# Patient Record
Sex: Female | Born: 1965 | Race: White | Hispanic: No | Marital: Single | Smoking: Current every day smoker
Health system: Southern US, Community
[De-identification: ages and names within clinical notes are randomized; demographics above are authoritative.]

## PROBLEM LIST (undated history)

## (undated) DIAGNOSIS — F419 Anxiety disorder, unspecified: Secondary | ICD-10-CM

## (undated) DIAGNOSIS — G4730 Sleep apnea, unspecified: Secondary | ICD-10-CM

## (undated) DIAGNOSIS — M797 Fibromyalgia: Secondary | ICD-10-CM

## (undated) DIAGNOSIS — M5030 Other cervical disc degeneration, unspecified cervical region: Secondary | ICD-10-CM

## (undated) DIAGNOSIS — F988 Other specified behavioral and emotional disorders with onset usually occurring in childhood and adolescence: Secondary | ICD-10-CM

## (undated) DIAGNOSIS — M25519 Pain in unspecified shoulder: Secondary | ICD-10-CM

## (undated) DIAGNOSIS — K219 Gastro-esophageal reflux disease without esophagitis: Secondary | ICD-10-CM

## (undated) DIAGNOSIS — F329 Major depressive disorder, single episode, unspecified: Secondary | ICD-10-CM

## (undated) DIAGNOSIS — G8929 Other chronic pain: Secondary | ICD-10-CM

## (undated) DIAGNOSIS — F17200 Nicotine dependence, unspecified, uncomplicated: Secondary | ICD-10-CM

## (undated) DIAGNOSIS — F32A Depression, unspecified: Secondary | ICD-10-CM

## (undated) HISTORY — DX: Major depressive disorder, single episode, unspecified: F32.9

## (undated) HISTORY — DX: Other chronic pain: G89.29

## (undated) HISTORY — DX: Nicotine dependence, unspecified, uncomplicated: F17.200

## (undated) HISTORY — DX: Depression, unspecified: F32.A

## (undated) HISTORY — PX: WISDOM TOOTH EXTRACTION: SHX21

## (undated) HISTORY — DX: Fibromyalgia: M79.7

## (undated) HISTORY — DX: Pain in unspecified shoulder: M25.519

## (undated) HISTORY — DX: Other specified behavioral and emotional disorders with onset usually occurring in childhood and adolescence: F98.8

## (undated) HISTORY — DX: Anxiety disorder, unspecified: F41.9

## (undated) HISTORY — DX: Gastro-esophageal reflux disease without esophagitis: K21.9

---

## 1990-09-08 HISTORY — PX: TUBAL LIGATION: SHX77

## 2001-07-27 ENCOUNTER — Other Ambulatory Visit: Admission: RE | Admit: 2001-07-27 | Discharge: 2001-07-27 | Payer: Self-pay | Admitting: Internal Medicine

## 2002-01-18 ENCOUNTER — Inpatient Hospital Stay (HOSPITAL_COMMUNITY): Admission: AD | Admit: 2002-01-18 | Discharge: 2002-01-19 | Payer: Self-pay | Admitting: Family Medicine

## 2002-01-18 ENCOUNTER — Encounter: Payer: Self-pay | Admitting: Family Medicine

## 2003-09-05 ENCOUNTER — Ambulatory Visit (HOSPITAL_COMMUNITY): Admission: RE | Admit: 2003-09-05 | Discharge: 2003-09-05 | Payer: Self-pay | Admitting: Internal Medicine

## 2004-07-08 ENCOUNTER — Ambulatory Visit (HOSPITAL_COMMUNITY): Admission: RE | Admit: 2004-07-08 | Discharge: 2004-07-08 | Payer: Self-pay | Admitting: Internal Medicine

## 2006-05-27 ENCOUNTER — Other Ambulatory Visit: Admission: RE | Admit: 2006-05-27 | Discharge: 2006-05-27 | Payer: Self-pay | Admitting: Obstetrics & Gynecology

## 2007-05-13 ENCOUNTER — Ambulatory Visit (HOSPITAL_COMMUNITY): Admission: RE | Admit: 2007-05-13 | Discharge: 2007-05-13 | Payer: Self-pay | Admitting: Family Medicine

## 2007-08-19 ENCOUNTER — Other Ambulatory Visit: Admission: RE | Admit: 2007-08-19 | Discharge: 2007-08-19 | Payer: Self-pay | Admitting: Obstetrics & Gynecology

## 2008-02-18 ENCOUNTER — Emergency Department (HOSPITAL_COMMUNITY): Admission: EM | Admit: 2008-02-18 | Discharge: 2008-02-19 | Payer: Self-pay | Admitting: Emergency Medicine

## 2008-11-09 ENCOUNTER — Ambulatory Visit (HOSPITAL_COMMUNITY): Admission: RE | Admit: 2008-11-09 | Discharge: 2008-11-09 | Payer: Self-pay | Admitting: Family Medicine

## 2010-09-30 ENCOUNTER — Encounter: Payer: Self-pay | Admitting: Family Medicine

## 2011-01-24 NOTE — H&P (Signed)
[facility]  Patient:    Robin Dawson, Robin Dawson Visit Number: [id number] MRN: [id number]          Service Type: MED Location: 2A A210 01 Attending Physician:  Colette Ribas Dictated by:   Colette Ribas, M.D. Admit Date:  01/18/2002 Discharge Date: 01/19/2002                           History and Physical  DATE OF BIRTH:  1965/11/04  PRIMARY CARE PHYSICIAN:  Dr. Colette Ribas.  ADMITTING DIAGNOSIS:  Chest pain, rule out myocardial infarction.  HISTORY OF PRESENTING ILLNESS:  [age]-year-old female who presented with one-hour episode of tightness in the chest, associated nausea and dizziness, no radiation, some associated diaphoresis, no shortness of breath or chest pain when she came to the office.  It was slightly tight still at a 1/10 chest pain when she came into the office.  Blood pressure was quite stable; other vital signs were stable as well.  The pain seemed to last only an hour.  She has had increased stress and she thought this could be the cause.  No significant cardiovascular risk factors other than cigarette use. No significant family history.  No prior history of coronary disease.  In the office, nitroglycerin was given, which removed the pain altogether, and aspirin 325 mg was given prior to her arriving at the office.  PAST MEDICAL HISTORY:  None.  PAST SURGICAL HISTORY:  None.  MEDICATIONS:  None.  ALLERGIES:  CODEINE.  SOCIAL HISTORY:  Tobacco use since age 45, one pack a day.  No alcohol.  No illicit drugs.  No cocaine use.  FAMILY HISTORY:  No significant history of coronary artery disease or hyperlipidemia.  PHYSICAL EXAMINATION:  VITAL SIGNS:  Temperature 98.2, pulse 68, respirations 22, blood pressure 130/65.  GENERAL:  Pleasant female when I saw her, in no acute distress.  HEENT:  Normocephalic, atraumatic.  Pupils equal, round and reactive. Extraocular muscles are intact.  Nasus and  oropharynx clear.  NECK:  Supple.  No lymphadenopathy.  No JVD.  CHEST:  Clear to auscultation bilaterally.  CARDIOVASCULAR:  Regular rate and rhythm.  Normal S1 and S2.  No S3, murmurs, gallops or rubs.  ABDOMEN:  Bowel sounds positive.  Soft, nontender and nondistended.  No hepatosplenomegaly.  No masses.  EXTREMITIES:  No cyanosis, clubbing or erythema and no edema.  ASSESSMENT:  [age]-year-old female with chest pain.  PLAN: 1. Admit to 2-A telemetry to rule out myocardial infarction. 2. ECG done in the office which showed no acute ST changes. 3. CPKs, MBs, troponins x3 q.8h. 4. CBC and Chem-12 on admission. 5. Fasting lipids. 6. Chest x-ray on admission. 7. Consult Dr. Netta Cedars for risk stratification. Dictated by:   Colette Ribas, M.D. Attending Physician:  Colette Ribas DD:  01/18/02 TD:  01/20/02 Job: [id number]

## 2011-01-24 NOTE — Op Note (Signed)
[name], Robin Dawson                         ACCOUNT NO.:  [id number]   MEDICAL RECORD NO.:  [id number]                   PATIENT TYPE:  AMB   LOCATION:  DAY                                  FACILITY:  [facility]   PHYSICIAN:  Bernerd Limbo. [name], M.D.             DATE OF BIRTH:  [birth year]   DATE OF PROCEDURE:  09/05/2003  DATE OF DISCHARGE:                                 OPERATIVE REPORT   PREOPERATIVE DIAGNOSIS:  Request for tubal ligation.   POSTOPERATIVE DIAGNOSIS:  Request for tubal ligation.   PROCEDURE:  Laparoscopic tubal ligation.   SURGEON:  Buena Irish, M.D.   OPERATIVE PROCEDURE:  Under adequate general anesthesia the patient prepped  and draped in the lithotomy position.  A Foley catheter was inserted and the  uterine manipulator was inserted into the cervical os and held in place.  A  small incision was made below the umbilicus and a Veress needle inserted  into the peritoneal cavity.  Approximately 2-3 liters of CO2 was inserted  with 15 mmHg.   Following this the trocar with the camera attached through this opening and  the peritoneal cavity visualized.  One small incision was made on the right  of the midportion of the lower abdomen and through this opening a 5-mm  trocar was inserted.  A second incision was made on the left in  approximately the same position and the 10 mm trocar inserted through this  opening.  The uterus was easily visualized.  The left tube was grasped with  a clamp, clipped proximally and distally and then transected utilizing the  harmonic scalpel.  No specimen was obtained.   Attention was then turned to the right tube.  The tube was then clamped  proximally and distally and then transected using the harmonic scalpel.  There was little if any blood loss.  At the completion of the operation the  area was dry.  The instruments were removed under direct vision; after the  abdomen had been desufflated.  The fascial layers were closed with  interrupted #1 Vicryl; and the skin closed with skin clip.  OpSite dressing  applied.  Foley catheter removed.  Uterine manipulator removed.  The patient  tolerated the procedure nicely and left the room in good condition.      ___________________________________________                                            Bernerd Limbo. [name], M.D.   NMD/MEDQ  D:  09/05/2003  T:  09/05/2003  Job:  [id number]

## 2011-01-24 NOTE — H&P (Signed)
NAMESINDI, BECKWORTH                         ACCOUNT NO.:  [id number]   MEDICAL RECORD NO.:  [id number]                   PATIENT TYPE:  AMB   LOCATION:  DAY                                  FACILITY:  [facility]   PHYSICIAN:  Bernerd Limbo. [name], M.D.             DATE OF BIRTH:  [birth year]   DATE OF ADMISSION:  DATE OF DISCHARGE:                                HISTORY & PHYSICAL   This 45 year old white female is admitted for a laparoscopic tubal ligation.   HISTORY OF PRESENT ILLNESS:  This patient is gravida 2, para 0, AB 2. She  has been in good health. She has been taking Provera injections since 1996.  She has opted to have tubal ligation, and she is admitted now for that  procedure.   PAST MEDICAL HISTORY:  She is not on any medication. She is allergic to  CODEINE. She has had no prior surgery. She does have a maternal history of  cancer of the breast-two or three aunts on her mother's side.   PHYSICAL EXAMINATION:  GENERAL:  Reveals a healthy, well-developed, well-  nourished, 45 year old white female in no distress. She weights 122-3/4  pounds. Blood pressure is 138/58, pulse 80, respirations 20.  HEENT:  Normal.  NECK:  Supple. Thyroid not enlarged. No palpable cervical adenopathy.  CARDIOVASCULAR:  Regular sinus rhythm. No rubs or murmurs.  RESPIRATORY:  Chest clear to percussion and auscultation.  BREASTS:  Moderate sized and symmetrical. No masses. Tissue is a little bit  irregular in both glands. Examination of both axillae is normal.  ABDOMEN:  Soft. No operative scars. No areas of musculoskeletal tenderness.  No visceromegaly. Normal peristalsis.  LIMBS AND BACK:  Negative.  PELVIC:  There is a marital introitus. No pathology of Bartholin and Skene  glands.  Cervix is in mid position. Uterus is anterior. It appears to be  normal in size and shape. Examination of both adnexa was normal. Recent Pap  smear was normal. Rectal vaginal examination was normal.   ADMISSION  DIAGNOSES:  Multiparity with request for tubal ligation.   DISPOSITION:  The patient is admitted for laparoscopic tubal ligation. The  surgery, risks and complications and possible consequences have been  discussed with the patient, and she agrees to the surgery. She has been  scheduled for December 28.     ___________________________________________                                         Bernerd Limbo. [name], M.D.   NMD/MEDQ  D:  09/04/2003  T:  09/04/2003  Job:  [id number]

## 2011-01-24 NOTE — Discharge Summary (Signed)
[facility]  Patient:    Robin Dawson, Robin Dawson Visit Number: [id number] MRN: [id number]          Service Type: MED Location: 2A A210 01 Attending Physician:  Darlin Priestly Dictated by:   Colette Ribas, M.D. Admit Date:  01/18/2002 Discharge Date: 01/19/2002                             Discharge Summary  DISCHARGE DIAGNOSIS:  Chest pain probable noncardiac.  HISTORY OF PRESENT ILLNESS AND PAST MEDICAL HISTORY:  Please see admission history and physical.  HOSPITAL COURSE:  A 45 year old female with a long history of anxiety presentED with left-sided chest pain.  She is admitted for rule out protocol. CPKs and troponins were negative.  The first two CPKs were 291 and 210 but with an MB fraction of 1.3 and 0.81.  The third set is pending.  The first two sets of Troponins were negative.  ECGs remained normal sinus rhythm with no acute changes.  Dr. Domingo Sep was consulted who initially heparinized her and started beta blocker. The patient had no further episodes of chest pain and did quite well. Both the patient and I felt like this is probably stress induced.  Fasting lipid panel is pending upon discharge as well.  I discussed with the patient that the third set of enzymes were negative.  She could be discharged home for follow up with myself in 1 week and Dr. Domingo Sep as an outpatient for further risk stratification with stress test.  One other note on admission was that the chest x-ray had a questionable early right middle lobe infiltrate so we covered her with Z-Pak.  DISCHARGE PHYSICAL EXAMINATION:  Please see progress note from day of discharge.  DISCHARGE MEDICATIONS: 1. Nitroglycerin p.r.n. 2. Z-Pak as directed.  No further medications, now and she is going to follow up as directed. Dictated by:   Colette Ribas, M.D. Attending Physician:  Darlin Priestly DD:  01/19/02 TD:  01/20/02 Job: [id number]

## 2011-06-05 LAB — URINALYSIS, ROUTINE W REFLEX MICROSCOPIC
Bilirubin Urine: NEGATIVE
Nitrite: NEGATIVE
Specific Gravity, Urine: 1.025
pH: 6

## 2011-06-05 LAB — URINE MICROSCOPIC-ADD ON

## 2011-06-05 LAB — PREGNANCY, URINE: Preg Test, Ur: NEGATIVE

## 2011-12-20 LAB — HM PAP SMEAR: HM PAP: ABNORMAL

## 2012-01-01 LAB — HM MAMMOGRAPHY: HM Mammogram: NORMAL

## 2013-01-18 ENCOUNTER — Encounter: Payer: Self-pay | Admitting: Obstetrics & Gynecology

## 2013-01-20 ENCOUNTER — Ambulatory Visit: Payer: Self-pay | Admitting: Obstetrics & Gynecology

## 2013-02-06 ENCOUNTER — Emergency Department (HOSPITAL_COMMUNITY)
Admission: EM | Admit: 2013-02-06 | Discharge: 2013-02-06 | Disposition: A | Discharge status: Home / Self Care | Payer: BC Managed Care – PPO | Attending: Emergency Medicine | Admitting: Emergency Medicine

## 2013-02-06 ENCOUNTER — Other Ambulatory Visit: Payer: Self-pay

## 2013-02-06 ENCOUNTER — Encounter (HOSPITAL_COMMUNITY): Payer: Self-pay | Admitting: *Deleted

## 2013-02-06 ENCOUNTER — Emergency Department (HOSPITAL_COMMUNITY): Payer: BC Managed Care – PPO

## 2013-02-06 DIAGNOSIS — F988 Other specified behavioral and emotional disorders with onset usually occurring in childhood and adolescence: Secondary | ICD-10-CM | POA: Insufficient documentation

## 2013-02-06 DIAGNOSIS — M542 Cervicalgia: Secondary | ICD-10-CM | POA: Insufficient documentation

## 2013-02-06 DIAGNOSIS — M5030 Other cervical disc degeneration, unspecified cervical region: Secondary | ICD-10-CM | POA: Insufficient documentation

## 2013-02-06 DIAGNOSIS — F17200 Nicotine dependence, unspecified, uncomplicated: Secondary | ICD-10-CM | POA: Insufficient documentation

## 2013-02-06 DIAGNOSIS — Z79899 Other long term (current) drug therapy: Secondary | ICD-10-CM | POA: Insufficient documentation

## 2013-02-06 DIAGNOSIS — F329 Major depressive disorder, single episode, unspecified: Secondary | ICD-10-CM | POA: Insufficient documentation

## 2013-02-06 DIAGNOSIS — R209 Unspecified disturbances of skin sensation: Secondary | ICD-10-CM | POA: Insufficient documentation

## 2013-02-06 DIAGNOSIS — M25519 Pain in unspecified shoulder: Secondary | ICD-10-CM | POA: Insufficient documentation

## 2013-02-06 DIAGNOSIS — G8929 Other chronic pain: Secondary | ICD-10-CM | POA: Insufficient documentation

## 2013-02-06 DIAGNOSIS — F3289 Other specified depressive episodes: Secondary | ICD-10-CM | POA: Insufficient documentation

## 2013-02-06 DIAGNOSIS — M62838 Other muscle spasm: Secondary | ICD-10-CM | POA: Insufficient documentation

## 2013-02-06 DIAGNOSIS — F411 Generalized anxiety disorder: Secondary | ICD-10-CM | POA: Insufficient documentation

## 2013-02-06 DIAGNOSIS — M25512 Pain in left shoulder: Secondary | ICD-10-CM

## 2013-02-06 HISTORY — DX: Other cervical disc degeneration, unspecified cervical region: M50.30

## 2013-02-06 MED ORDER — METHOCARBAMOL 500 MG PO TABS: 1000.0000 mg | ORAL_TABLET | Freq: Four times a day (QID) | ORAL | Status: DC | PRN

## 2013-02-06 MED ORDER — DIAZEPAM 5 MG PO TABS
5.0000 mg | ORAL_TABLET | Freq: Once | ORAL | Status: AC
  Administered 2013-02-06: 5 mg via ORAL
  Filled 2013-02-06: qty 1

## 2013-02-06 MED ORDER — OXYCODONE-ACETAMINOPHEN 5-325 MG PO TABS
2.0000 | ORAL_TABLET | Freq: Once | ORAL | Status: AC
  Administered 2013-02-06: 2 via ORAL
  Filled 2013-02-06: qty 2

## 2013-02-06 MED ORDER — NAPROXEN 250 MG PO TABS: 250.0000 mg | ORAL_TABLET | Freq: Two times a day (BID) | ORAL | Status: DC

## 2013-02-06 MED ORDER — MORPHINE SULFATE 4 MG/ML IJ SOLN
4.0000 mg | Freq: Once | INTRAMUSCULAR | Status: AC
  Administered 2013-02-06: 4 mg via INTRAMUSCULAR
  Filled 2013-02-06: qty 1

## 2013-02-06 MED ORDER — HYDROCODONE-ACETAMINOPHEN 5-325 MG PO TABS: ORAL_TABLET | ORAL | Status: DC

## 2013-02-06 NOTE — ED Notes (Addendum)
Pt c/o neck, shoulder and left arm pain/numbness x 48 hrs

## 2013-02-06 NOTE — ED Provider Notes (Signed)
History     CSN: [id number]  Arrival date & time 02/06/13  1845   First MD Initiated Contact with Patient 02/06/13 1920      Chief Complaint  Patient presents with  . Neck Pain  . Shoulder Pain  . Numbness     HPI Pt was seen at 1930.  Per pt, c/o gradual onset and persistence of constant acute flair of her chronic left shoulder and neck "pain" for the past 4 days.  Denies any change in her usual chronic pain pattern for the past "at least 5 years."  States the pain worsened after she has been cleaning the garage and working out in the yard for the past few days. Pain worsens with palpation of the area and body position changes.  States she "used to get cortisone shots" for her shoulder pain, with last injection approx 4 to 5 years ago. Denies incont/retention of bowel or bladder, no saddle anesthesia, no focal motor weakness, no tingling/numbness in extremities, no fevers, no injury, no abd pain, no CP/SOB.   The symptoms have been associated with no other complaints. The patient has a significant history of similar symptoms previously and prior evals for same.     Past Medical History  Diagnosis Date  . ADD (attention deficit disorder)   . Depression   . Anxiety   . Smoker     3/4 PPD  . Chronic left shoulder pain   . Chronic neck pain   . DDD (degenerative disc disease), cervical   . Chronic right shoulder pain     Past Surgical History  Procedure Laterality Date  . Tubal ligation  1992      History  Substance Use Topics  . Smoking status: Current Every Day Smoker  . Smokeless tobacco: Not on file  . Alcohol Use: No    OB History   Grav Para Term Preterm Abortions TAB SAB Ect Mult Living   2 2 2       2       Review of Systems ROS: Statement: All systems negative except as marked or noted in the HPI; Constitutional: Negative for fever and chills. ; ; Eyes: Negative for eye pain, redness and discharge. ; ; ENMT: Negative for ear pain, hoarseness, nasal  congestion, sinus pressure and sore throat. ; ; Cardiovascular: Negative for chest pain, palpitations, diaphoresis, dyspnea and peripheral edema. ; ; Respiratory: Negative for cough, wheezing and stridor. ; ; Gastrointestinal: Negative for nausea, vomiting, diarrhea, abdominal pain, blood in stool, hematemesis, jaundice and rectal bleeding. . ; ; Genitourinary: Negative for dysuria, flank pain and hematuria. ; ; Musculoskeletal: +neck pain, shoulder pain. Negative for back pain. Negative for swelling and trauma.; ; Skin: Negative for pruritus, rash, abrasions, blisters, bruising and skin lesion.; ; Neuro: Negative for headache, lightheadedness and neck stiffness. Negative for weakness, altered level of consciousness , altered mental status, extremity weakness, paresthesias, involuntary movement, seizure and syncope.       Allergies  Codeine  Home Medications   Current Outpatient Rx  Name  Route  Sig  Dispense  Refill  . amphetamine-dextroamphetamine (ADDERALL) 30 MG tablet   Oral   Take 30 mg by mouth daily.         Marland Kitchen escitalopram (LEXAPRO) 20 MG tablet   Oral   Take 20 mg by mouth daily.         Marland Kitchen ibuprofen (ADVIL,MOTRIN) 200 MG tablet   Oral   Take 600-800 mg by mouth every 6 (six)  hours as needed for pain.         Marland Kitchen LORazepam (ATIVAN) 1 MG tablet   Oral   Take 1 mg by mouth every 8 (eight) hours.           BP 164/90  Pulse 96  Temp(Src) 97.6 F (36.4 C) (Oral)  Resp 16  Ht 5' (1.524 m)  Wt 136 lb (61.689 kg)  BMI 26.56 kg/m2  SpO2 100%  LMP 01/30/2013  Physical Exam 1935: Physical examination:  Nursing notes reviewed; Vital signs and O2 SAT reviewed;  Constitutional: Well developed, Well nourished, Well hydrated, In no acute distress; Head:  Normocephalic, atraumatic; Eyes: EOMI, PERRL, No scleral icterus; ENMT: Mouth and pharynx normal, Mucous membranes moist; Neck: Supple, Full range of motion, No lymphadenopathy; Cardiovascular: Regular rate and rhythm, No  murmur, rub, or gallop; Respiratory: Breath sounds clear & equal bilaterally, No rales, rhonchi, wheezes.  Speaking full sentences with ease, Normal respiratory effort/excursion; Chest: Nontender, Movement normal; Abdomen: Soft, Nontender, Nondistended, Normal bowel sounds; Genitourinary: No CVA tenderness; Spine:  No midline CS, TS, LS tenderness.  +TTP left hypertonic trapezius muscle;; Extremities: Pulses normal, +left shoulder w/decreased ROM, per hx of chronic shoulder pain.  +generalized TTP entire joint. Clavicle NT, scapula NT, proximal humerus NT, biceps tendon NT over bicipital groove.  Motor strength at shoulder normal.  Sensation intact over deltoid region, distal NMS intact with left hand having intact motor strength and mildly decreased sensation in the distribution of the median, radial, and ulnar nerve function compared to opposite side.  Strong radial pulse.  +FROM left elbow with intact motor strength biceps and triceps muscles to resistance. No deformity. No rash. No edema, No calf edema or asymmetry.; Neuro: AA&Ox3, Major CN grossly intact.  Speech clear. No gross focal motor deficits in extremities.; Skin: Color normal, Warm, Dry.    ED Course  Procedures    MDM  MDM Reviewed: previous chart, nursing note and vitals Reviewed previous: MRI Interpretation: CT scan and ECG    Date: 02/06/2013  Rate: 85  Rhythm: normal sinus rhythm  QRS Axis: normal  Intervals: normal  ST/T Wave abnormalities: normal  Conduction Disutrbances:none  Narrative Interpretation:   Old EKG Reviewed: none available   11/2008 Left Shoulder MRI: IMPRESSION:  1. Mild supraspinatus and mild biceps long head tendinopathy.  2. Indistinctness of the rotator interval - potentially  incidental, but this is a finding which can sometimes be seen in  intensive capsulitis. Correlate with history and physical exam findings.  3. Red marrow redistribution is present. This is nonspecific can  be associated  with a variety of conditions including chronic  anemia, chronic heart failure, myelofibrosis, infiltrative marrow  disease, response to infection, and other conditions.  Provider: Gareth Morgan    Ct Cervical Spine Wo Contrast 02/06/2013   *RADIOLOGY REPORT*  Clinical Data: Left shoulder and neck pain.  No trauma.  CT CERVICAL SPINE WITHOUT CONTRAST  Technique:  Multidetector CT imaging of the cervical spine was performed. Multiplanar CT image reconstructions were also generated.  Comparison: None.  Findings: Normal alignment.  Prevertebral soft tissues are normal. Early degenerative disc disease changes, most pronounced at C6-7. There is a large central disc herniation at C4-5.  No neural foraminal narrowing.  No fracture.  No epidural or paraspinal hematoma.  IMPRESSION: No acute bony abnormality.  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   Original Report Authenticated By: Charlett Nose, M.D.     2120:  Pt feels "better" and wants to  go home now.  Is requesting another dose of pain meds before she leaves. Pt has significant hx of chronic neck and shoulder pain. Endorses the mild decreased sensation in her LUE is not new today and that she came to the ED due to pain.  Pt endorses acute flair of her usual long standing chronic pain today, no change from her usual chronic pain pattern.  Pt encouraged to f/u with her PMD, Ortho MD and Pain Management doctor for good continuity of care and control of her chronic pain.  Verb understanding.           Laray Anger, DO 02/08/13 1954

## 2013-02-06 NOTE — ED Notes (Signed)
Xray brought back pt 2045

## 2013-02-06 NOTE — ED Notes (Signed)
Pt presents with left shoulder and arm pain x 4 days with increasing pain. Pt states has been cleaning out garage and working in yard for past few  Days.  Pt denies trauma/injury. Pt denies chest pain and jaw pain at this time. NAD noted.

## 2013-02-15 ENCOUNTER — Other Ambulatory Visit: Payer: Self-pay | Admitting: Neurosurgery

## 2013-02-15 DIAGNOSIS — M5030 Other cervical disc degeneration, unspecified cervical region: Secondary | ICD-10-CM

## 2013-02-15 DIAGNOSIS — M5020 Other cervical disc displacement, unspecified cervical region: Secondary | ICD-10-CM

## 2013-02-15 DIAGNOSIS — M47812 Spondylosis without myelopathy or radiculopathy, cervical region: Secondary | ICD-10-CM

## 2013-02-24 ENCOUNTER — Encounter: Payer: Self-pay | Admitting: Obstetrics and Gynecology

## 2013-02-24 ENCOUNTER — Ambulatory Visit: Payer: Self-pay | Admitting: Obstetrics and Gynecology

## 2013-02-24 DIAGNOSIS — Z01419 Encounter for gynecological examination (general) (routine) without abnormal findings: Secondary | ICD-10-CM

## 2013-05-23 ENCOUNTER — Ambulatory Visit (HOSPITAL_COMMUNITY)
Admission: RE | Admit: 2013-05-23 | Discharge: 2013-05-23 | Disposition: A | Discharge status: Home / Self Care | Payer: BC Managed Care – PPO | Source: Ambulatory Visit | Attending: Family Medicine | Admitting: Family Medicine

## 2013-05-23 ENCOUNTER — Other Ambulatory Visit (HOSPITAL_COMMUNITY): Payer: Self-pay | Admitting: Family Medicine

## 2013-05-23 DIAGNOSIS — R05 Cough: Secondary | ICD-10-CM | POA: Insufficient documentation

## 2013-05-23 DIAGNOSIS — R059 Cough, unspecified: Secondary | ICD-10-CM | POA: Insufficient documentation

## 2013-09-08 DIAGNOSIS — M797 Fibromyalgia: Secondary | ICD-10-CM | POA: Insufficient documentation

## 2013-11-01 ENCOUNTER — Encounter: Payer: Self-pay | Admitting: Obstetrics & Gynecology

## 2013-11-03 ENCOUNTER — Ambulatory Visit: Payer: Self-pay | Admitting: Obstetrics & Gynecology

## 2013-12-06 ENCOUNTER — Ambulatory Visit: Payer: Self-pay | Admitting: Obstetrics & Gynecology

## 2014-01-05 ENCOUNTER — Encounter: Payer: Self-pay | Admitting: Obstetrics & Gynecology

## 2014-01-05 ENCOUNTER — Ambulatory Visit (INDEPENDENT_AMBULATORY_CARE_PROVIDER_SITE_OTHER): Payer: BC Managed Care – PPO | Admitting: Obstetrics & Gynecology

## 2014-01-05 VITALS — BP 140/88 | HR 80 | Resp 18 | Ht 62.0 in | Wt 145.0 lb

## 2014-01-05 DIAGNOSIS — Z01419 Encounter for gynecological examination (general) (routine) without abnormal findings: Secondary | ICD-10-CM

## 2014-01-05 DIAGNOSIS — N852 Hypertrophy of uterus: Secondary | ICD-10-CM

## 2014-01-05 DIAGNOSIS — Z0000 Encounter for general adult medical examination without abnormal findings: Secondary | ICD-10-CM

## 2014-01-05 DIAGNOSIS — Z124 Encounter for screening for malignant neoplasm of cervix: Secondary | ICD-10-CM

## 2014-01-05 LAB — POCT URINALYSIS DIPSTICK
BILIRUBIN UA: NEGATIVE
Glucose, UA: NEGATIVE
Ketones, UA: NEGATIVE
Leukocytes, UA: NEGATIVE
NITRITE UA: NEGATIVE
PH UA: 7
Protein, UA: NEGATIVE
RBC UA: NEGATIVE
Urobilinogen, UA: NEGATIVE

## 2014-01-05 NOTE — Patient Instructions (Signed)

## 2014-01-05 NOTE — Progress Notes (Signed)
48 y.o. [id number] DivorcedCaucasianF here for annual exam.  Had significant issues with bulging disc in neck and nerve injury/irritation.  She has been encouraged to consider surgery.  She has not decided to proceed yet.  Cycles are changing a little over the last year.  She has about three times a year where she has two cycles in a month.  Cycle is normally 3-4 days.  One day is typically heavy and two to three days are mild.    PCP Dr. Hilma Favors.  Has blood work next month scheduled.     Patient's last menstrual period was 12/18/2013.          Sexually active: yes  The current method of family planning is none.    Exercising: yes  Walking Smoker:  yes  Health Maintenance: Pap:  12/2011 Endometrial Cells Present. HR HPV: Neg. History of abnormal Pap:  no MMG:  12/2011 BI-RADS 1: neg Colonoscopy:  No BMD:   No TDaP:  2006 Screening Labs: PCP, Hb today: PCP, Urine today: Clear   reports that she has been smoking.  Her smokeless tobacco use includes Snuff. She reports that she drinks about one ounce of alcohol per week. She reports that she does not use illicit drugs.  Past Medical History  Diagnosis Date  . ADD (attention deficit disorder)   . Depression   . Anxiety   . Smoker     3/4 PPD  . Chronic left shoulder pain   . Chronic neck pain   . DDD (degenerative disc disease), cervical   . Chronic right shoulder pain   . GERD (gastroesophageal reflux disease)   . Anemia in pregnancy     Past Surgical History  Procedure Laterality Date  . Tubal ligation  1992    Current Outpatient Prescriptions  Medication Sig Dispense Refill  . amphetamine-dextroamphetamine (ADDERALL) 30 MG tablet Take 30 mg by mouth daily.      Marland Kitchen ibuprofen (ADVIL,MOTRIN) 200 MG tablet Take 600-800 mg by mouth every 6 (six) hours as needed for pain.      Marland Kitchen LORazepam (ATIVAN) 1 MG tablet Take 1 mg by mouth every 8 (eight) hours.      . traMADol-acetaminophen (ULTRACET) 37.5-325 MG per tablet Take 1 tablet by  mouth every 8 (eight) hours as needed.       Marland Kitchen escitalopram (LEXAPRO) 20 MG tablet Take 20 mg by mouth daily.       No current facility-administered medications for this visit.    Family History  Problem Relation Age of Onset  . Bipolar disorder Mother   . Breast cancer Maternal Grandmother   . Diabetes Paternal Grandmother   . Hypertension Paternal Grandfather   . Alzheimer's disease Father   . Bipolar disorder Sister     ROS:  Pertinent items are noted in HPI.  Otherwise, a comprehensive ROS was negative.  Exam:   BP 140/88  Pulse 80  Resp 18  Ht 5\' 2"  (1.575 m)  Wt 145 lb (65.772 kg)  BMI 26.51 kg/m2  LMP 12/18/2013  Weight change: +14#  Height: 5\' 2"  (157.5 cm)  Ht Readings from Last 3 Encounters:  01/05/14 5\' 2"  (1.575 m)  02/06/13 5' (1.524 m)    General appearance: alert, cooperative and appears stated age Head: Normocephalic, without obvious abnormality, atraumatic Neck: no adenopathy, supple, symmetrical, trachea midline and thyroid normal to inspection and palpation Lungs: clear to auscultation bilaterally Breasts: normal appearance, no masses or tenderness Heart: regular rate and rhythm Abdomen: soft,  non-tender; bowel sounds normal; no masses,  no organomegaly Extremities: extremities normal, atraumatic, no cyanosis or edema Skin: Skin color, texture, turgor normal. No rashes or lesions Lymph nodes: Cervical, supraclavicular, and axillary nodes normal. No abnormal inguinal nodes palpated Neurologic: Grossly normal   Pelvic: External genitalia:  no lesions              Urethra:  normal appearing urethra with no masses, tenderness or lesions              Bartholins and Skenes: normal                 Vagina: normal appearing vagina with normal color and discharge, no lesions              Cervix: no lesions              Pap taken: yes Bimanual Exam:  Uterus:  10 weeks in size and gobular, c/w fibroids              Adnexa: normal adnexa and no mass,  fullness, tenderness               Rectovaginal: Confirms               Anus:  normal sphincter tone, no lesions  A:  Well Woman with normal exam H/O anxiety, depression DDD and bulging disc Enlarged uterus on exam  P:   Mammogram yearly.  Scheduled for pt as is overdue pap smear only today. Labs with PCP next month. Plan TVUS. return annually or prn  An After Visit Summary was printed and given to the patient.

## 2014-01-09 ENCOUNTER — Telehealth: Payer: Self-pay | Admitting: Obstetrics & Gynecology

## 2014-01-09 LAB — IPS PAP SMEAR ONLY

## 2014-01-09 NOTE — Telephone Encounter (Signed)
Left message for patient to call back. Need to discuss oop for PUS

## 2014-01-12 NOTE — Telephone Encounter (Signed)
Patient was calling sabrina back said she will try back in  10 mins

## 2014-01-12 NOTE — Telephone Encounter (Signed)
Spoke with patient. Advised of $35 copay quoted as patient liability for PUS. Scheduled PUS. Advised patient of 72 hour cancellation policy and $956 cancellation fee. Patient agreeable.  Mailed the In-Office procedure form that includes appointment date and time, patient copay, and cancellation policy.

## 2014-02-16 ENCOUNTER — Ambulatory Visit (INDEPENDENT_AMBULATORY_CARE_PROVIDER_SITE_OTHER): Payer: BC Managed Care – PPO

## 2014-02-16 ENCOUNTER — Ambulatory Visit (INDEPENDENT_AMBULATORY_CARE_PROVIDER_SITE_OTHER): Payer: BC Managed Care – PPO | Admitting: Obstetrics & Gynecology

## 2014-02-16 VITALS — BP 140/80 | HR 80 | Resp 16 | Wt 136.0 lb

## 2014-02-16 DIAGNOSIS — N852 Hypertrophy of uterus: Secondary | ICD-10-CM

## 2014-02-16 DIAGNOSIS — D219 Benign neoplasm of connective and other soft tissue, unspecified: Secondary | ICD-10-CM

## 2014-02-16 DIAGNOSIS — N920 Excessive and frequent menstruation with regular cycle: Secondary | ICD-10-CM

## 2014-02-16 DIAGNOSIS — N949 Unspecified condition associated with female genital organs and menstrual cycle: Secondary | ICD-10-CM

## 2014-02-16 DIAGNOSIS — D259 Leiomyoma of uterus, unspecified: Secondary | ICD-10-CM

## 2014-02-16 DIAGNOSIS — N938 Other specified abnormal uterine and vaginal bleeding: Secondary | ICD-10-CM

## 2014-02-16 NOTE — Progress Notes (Signed)
48 y.o. Robin Dawson Divorcedfemale here for a pelvic ultrasound due to enlarged uterus noted on physical exam 12/3013.  Pt has only been seen one additional time in our office for her new gyn exam over two years ago.  The physical exam was different between these two visits.  Also, her cycles have gotten more irregular.  She will sometimes cycle two times a month.  One day is usually quite heavy and she is passing many more clots than she used to do.  LMP:  01/21/14  Sexually active:  yes  Contraception: bilateral tubal ligation  FINDINGS: UTERUS: 9.5 x 6.8 x 4.8cm with 4cm left intramural fibroid EMS: 9.36mm, symmetric ADNEXA:   Left ovary 2.9 x 2.3 x 1.9cm   Right ovary 2.2 x 1.3 x 1.1cm with 2.1cm cyst with single, avascular septation measuring 39mm.  Overall, cyst is avascular as well. CUL DE SAC: no free fluid  Images reviewed with pt.  As her cycles have really gotten heavier with more clots over the last couple of years, she is interested in treatment.  As pt is a smoker, we cannot use any estrogen containing pills.  She is not really interested in oral medication therapy.  Oral progesterones dsicussed with pt as well as IUD, Depo Provera, Nexplanon.  Myomectomy, endometrial ablation, and hysterectomy discussed.  Procedure with recovery discussed.  She is most interested in hysterectomy and would prefer to do this around Christmas.  We looked at the calendar together and she wants to schedule for 08/21/14.  Assessment:  Uterine fibroids, DUB, menorrhagia Plan: Repeat PUS 4 months.  Can go ahead and schedule surgery for 08/21/14.  Can cancel if pt changes her mind.  ~25 minutes spent with patient >50% of time was in face to face discussion of above.

## 2014-02-17 ENCOUNTER — Encounter: Payer: Self-pay | Admitting: Obstetrics & Gynecology

## 2014-02-17 DIAGNOSIS — D219 Benign neoplasm of connective and other soft tissue, unspecified: Secondary | ICD-10-CM | POA: Insufficient documentation

## 2014-02-17 DIAGNOSIS — N920 Excessive and frequent menstruation with regular cycle: Secondary | ICD-10-CM | POA: Insufficient documentation

## 2014-02-17 DIAGNOSIS — N938 Other specified abnormal uterine and vaginal bleeding: Secondary | ICD-10-CM | POA: Insufficient documentation

## 2014-06-06 NOTE — Addendum Note (Signed)
Addended by: Michele Mcalpine on: 06/06/2014 10:50 AM   Modules accepted: Orders

## 2014-06-07 ENCOUNTER — Telehealth: Payer: Self-pay | Admitting: Obstetrics & Gynecology

## 2014-06-07 NOTE — Telephone Encounter (Signed)
Left message for patient to call back. Need to go over surgery benefits. °

## 2014-06-15 ENCOUNTER — Ambulatory Visit (INDEPENDENT_AMBULATORY_CARE_PROVIDER_SITE_OTHER): Payer: BC Managed Care – PPO

## 2014-06-15 ENCOUNTER — Ambulatory Visit (INDEPENDENT_AMBULATORY_CARE_PROVIDER_SITE_OTHER): Payer: BC Managed Care – PPO | Admitting: Obstetrics & Gynecology

## 2014-06-15 VITALS — BP 134/82 | Ht 62.0 in | Wt 134.0 lb

## 2014-06-15 DIAGNOSIS — N921 Excessive and frequent menstruation with irregular cycle: Secondary | ICD-10-CM

## 2014-06-15 DIAGNOSIS — N920 Excessive and frequent menstruation with regular cycle: Secondary | ICD-10-CM

## 2014-06-15 DIAGNOSIS — D259 Leiomyoma of uterus, unspecified: Secondary | ICD-10-CM

## 2014-06-15 DIAGNOSIS — N938 Other specified abnormal uterine and vaginal bleeding: Secondary | ICD-10-CM

## 2014-06-15 DIAGNOSIS — N898 Other specified noninflammatory disorders of vagina: Secondary | ICD-10-CM

## 2014-06-15 DIAGNOSIS — N852 Hypertrophy of uterus: Secondary | ICD-10-CM

## 2014-06-15 DIAGNOSIS — D219 Benign neoplasm of connective and other soft tissue, unspecified: Secondary | ICD-10-CM

## 2014-06-15 DIAGNOSIS — D251 Intramural leiomyoma of uterus: Secondary | ICD-10-CM

## 2014-06-15 MED ORDER — FLUCONAZOLE 150 MG PO TABS: 150.0000 mg | ORAL_TABLET | Freq: Once | ORAL | Status: DC

## 2014-06-15 NOTE — Progress Notes (Signed)
48 y.o.  G2P2 Divorcedfemale here for a pelvic ultrasound due to enlarging uterine fibroid.  Last ultrasound was 02/16/14 showing a 3.57mm intramural fibroid.  Pt reports bleeding is worse and she thinks the fibroid is growing.  Having some increased cramps with cycle as well.  Pt reports new onset of vaginal discharge since recent antibiotic use.  Has vaginal itching as well.  Would like evaluation today.    No LMP recorded.  Sexually active:  yes  Contraception: bilateral tubal ligation  FINDINGS: UTERUS: 9.5 x 6.4 x 4.9cm with 4.6cm intrmural fibroid, increased form 3.8cm 02/16/14 EMS: 15.60mm ADNEXA:   Left ovary 4 x 1.5 x 1.8cm   Right ovary 3.7 x 2.3 x 1.8cm CUL DE SAC: no free fluid  Images reviewed with pt.  The fibroid is indeed increasing in size.  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  She is planning on continuing with this as scheduled.  She does have a pre-op appt before that surgery as well.  Due to thickness of endometrium and changing fibroid, recommend endometrial biopsy which will be done today.  Verbal and written consent obtained.     Pt reports increased discharge over the last two weeks.  She was recently on antibiotics but she doesn't have that much itching.  Mild odor.  No new sexual partner.  Possibly could be due to fibroid.  Exam:  NAEFG.  Normal vagina.  Creamy discharge present.  Wet smear obtained.  No cervical motion tenderness.  Uterus is mobile.  Proceeded to endometrial biopsy.  Procedure:  With speculum in place, cervix visualized and cleansed with betadine prep.  A single toothed tenaculum was applied to the anterior lip of the cervix.  Endometrial pipelle was advanced through the cervix into the endometrial cavity without difficulty.  Pipelle passed to 8cm.  Suction applied and pipelle removed with good tissue sample obtained.  Tenculum removed.  No bleeding noted.  Patient tolerated  procedure well.  Wet smear:  Ph 4.5.  Saline: no trich, no WBCs.  KOH:  + yeast, neg whiff  Assessment:  Enlarging Fibroid uterus Thickened endometrium Vaginal discharge with recent antibiotic use  Plan: Endometrial biopsy pending Surgery planned for 08/21/14.  Already is scheduled.  Will see pt for pre op appt closer to surgery time Diflucan 150mg  po x 1, repeat 48 hours Urine GC/Chl testing

## 2014-06-16 LAB — GC/CHLAMYDIA PROBE AMP, URINE
CHLAMYDIA, SWAB/URINE, PCR: NEGATIVE
GC Probe Amp, Urine: NEGATIVE

## 2014-06-21 NOTE — Telephone Encounter (Signed)
Left message for patient to call back. Need to go over surgery benefits. °

## 2014-06-22 ENCOUNTER — Telehealth: Payer: Self-pay

## 2014-06-22 NOTE — Telephone Encounter (Signed)
Lmtcb//kn 

## 2014-06-22 NOTE — Telephone Encounter (Signed)
Message copied by Robley Fries on Thu Jun 22, 2014  2:54 PM ------      Message from: Megan Salon      Created: Wed Jun 21, 2014  2:24 PM       Inform pt endometrial biopsy showed no abnormal cells.  GC/CHL testing negative.  How is discharge? ------

## 2014-06-29 NOTE — Telephone Encounter (Signed)
Left message for patient to call back. Need to go over surgery benefits. °

## 2014-07-04 NOTE — Telephone Encounter (Signed)
Reviewed with Dr Sabra Heck. She is aware that patient is planning to proceed with surgery  Call to patient, LMTCB. LM advising that we need to hear back from her confirming her plans for December. Needs to speak to both [place] and Gay Filler.

## 2014-07-05 ENCOUNTER — Encounter: Payer: Self-pay | Admitting: Obstetrics & Gynecology

## 2014-07-05 DIAGNOSIS — N852 Hypertrophy of uterus: Secondary | ICD-10-CM | POA: Insufficient documentation

## 2014-07-05 NOTE — Telephone Encounter (Signed)
Spoke with patient. Advised that per benefit quote received, she will be responsible to pay $1502.12 for the surgeons portion of her surgery. Advised that per our office policy, payment must be received in full at least 2 weeks prior to scheduled surgery date. Surgery is scheduled 12.14.2015, payment is due 11.30.2015. Advised that she will receive a separate telephone call regarding facility charges. Patient agreeable.

## 2014-07-10 ENCOUNTER — Encounter: Payer: Self-pay | Admitting: Obstetrics & Gynecology

## 2014-07-14 NOTE — Telephone Encounter (Signed)
Pt wants to reschedule her surgery to March 21,2016 if possible.

## 2014-07-14 NOTE — Telephone Encounter (Signed)
Return call to patient, LMTCB.  

## 2014-07-17 NOTE — Telephone Encounter (Signed)
See next phone encounter.  Routing to provider for final review. Patient agreeable to disposition. Will close encounter   

## 2014-07-17 NOTE — Telephone Encounter (Signed)
Return call to patient. LMTCB.

## 2014-07-19 NOTE — Telephone Encounter (Signed)
Agree with plan.  Encounter closed. 

## 2014-07-19 NOTE — Telephone Encounter (Signed)
Return call to patient. States she is has to cancel surgery due to [facility] cost. Was not expecting to owe cost up front. Needs time to plan and save this money so would like to move surgery to March 2016. Disucussed that current estimate may be different in January after insurance year starts over. Gabriel Cirri will not be able to give her new estimate until new plan year information is available which may not be until 09-08-14.  Will cancel surgery for now. Once new estimate for 2016 can be determined and patient agreeable to payment date with business office, they will notify me to proceed with rescheduling. Routing to provider for review. Plan agreeable?

## 2014-07-19 NOTE — Telephone Encounter (Signed)
Call to patient requesting update this am. Need update this am to move forward. LMTCB.

## 2014-07-19 NOTE — Telephone Encounter (Signed)
See email below from patient:  Name:  Robin Dawson Address:  [address] City/State/Zip:  [place] [address] Email:  [email or web address] Phone:  [phone] Comments:  Nov. 11, 2015 @ 10:06 am  Returning call for Gay Filler to reschedule surgery.

## 2014-08-21 ENCOUNTER — Ambulatory Visit (HOSPITAL_COMMUNITY)
Admission: RE | Admit: 2014-08-21 | Payer: BC Managed Care – PPO | Source: Ambulatory Visit | Admitting: Obstetrics & Gynecology

## 2014-08-21 ENCOUNTER — Other Ambulatory Visit (HOSPITAL_COMMUNITY): Payer: Self-pay | Admitting: Neurosurgery

## 2014-08-21 ENCOUNTER — Encounter (HOSPITAL_COMMUNITY): Admission: RE | Payer: Self-pay | Source: Ambulatory Visit

## 2014-08-21 DIAGNOSIS — M4722 Other spondylosis with radiculopathy, cervical region: Secondary | ICD-10-CM

## 2014-08-21 SURGERY — ROBOTIC ASSISTED TOTAL HYSTERECTOMY
Anesthesia: General

## 2014-08-25 ENCOUNTER — Ambulatory Visit (HOSPITAL_COMMUNITY)
Admission: RE | Admit: 2014-08-25 | Discharge: 2014-08-25 | Disposition: A | Discharge status: Home / Self Care | Payer: BC Managed Care – PPO | Source: Ambulatory Visit | Attending: Neurosurgery | Admitting: Neurosurgery

## 2014-08-25 DIAGNOSIS — M4722 Other spondylosis with radiculopathy, cervical region: Secondary | ICD-10-CM | POA: Diagnosis not present

## 2014-08-25 DIAGNOSIS — M542 Cervicalgia: Secondary | ICD-10-CM | POA: Diagnosis present

## 2014-09-06 ENCOUNTER — Telehealth: Payer: Self-pay | Admitting: Obstetrics & Gynecology

## 2014-09-06 NOTE — Telephone Encounter (Signed)
Received 2016 benefits from patients insurance  Call to patient to relay the information Left message for patient to call back

## 2014-09-18 NOTE — Telephone Encounter (Signed)
Left message for patient to call back. Need to go over surgery benefits. °

## 2014-09-27 NOTE — Telephone Encounter (Signed)
Left message for patient to call back. Need to go over surgery benefits. °

## 2014-10-04 NOTE — Telephone Encounter (Signed)
Dr Sabra Heck, patieint previously canceled case in December 2015 and has not returned calls regarding 2016 benefits. Should we continue to contact her or just wait till she calls [place]?

## 2014-10-05 NOTE — Telephone Encounter (Signed)
Robin Dawson. Encounter closed.

## 2014-10-05 NOTE — Telephone Encounter (Signed)
As she is not on the schedule, ok to just wait until she calls.  Thanks.  Ok to close encounter.

## 2015-01-12 ENCOUNTER — Encounter: Payer: Self-pay | Admitting: Obstetrics & Gynecology

## 2015-01-12 ENCOUNTER — Ambulatory Visit (INDEPENDENT_AMBULATORY_CARE_PROVIDER_SITE_OTHER): Payer: BC Managed Care – PPO | Admitting: Obstetrics & Gynecology

## 2015-01-12 VITALS — BP 160/88 | HR 56 | Resp 25 | Ht 61.25 in | Wt 158.8 lb

## 2015-01-12 DIAGNOSIS — Z0000 Encounter for general adult medical examination without abnormal findings: Secondary | ICD-10-CM

## 2015-01-12 DIAGNOSIS — Z01419 Encounter for gynecological examination (general) (routine) without abnormal findings: Secondary | ICD-10-CM | POA: Diagnosis not present

## 2015-01-12 LAB — POCT URINALYSIS DIPSTICK
BILIRUBIN UA: NEGATIVE
GLUCOSE UA: NEGATIVE
KETONES UA: NEGATIVE
NITRITE UA: NEGATIVE
PH UA: 6
Protein, UA: NEGATIVE
RBC UA: NEGATIVE
Urobilinogen, UA: NEGATIVE

## 2015-01-12 NOTE — Progress Notes (Signed)
49 y.o. Robin Dawson DivorcedCaucasianF here for annual exam.   Cycles still regular but some days are heavy.  No significant change from last year.  Still interested in hysterectomy.  She is taking care of her 43 and 9 year old grandchildren as her daughter is in rehab and DSS took away custody from their biological father.  PCP:  Dr. Hilma Favors.  Last appt was 2/16.  Has had screening blood work this year.    Patient's last menstrual period was 12/10/2014.          Sexually active: Yes.    The current method of family planning is tubal ligation.    Exercising: Yes.    walking Smoker:  Yes   Health Maintenance: Pap:  01/05/14 WNL History of abnormal Pap:  no MMG:  01/09/14 3D-normal Colonoscopy:  none BMD:   none TDaP:  PCP Screening Labs: PCP, Hb today: PCP, Urine today: WBC-trace, PH-6.0   reports that she has been smoking.  Her smokeless tobacco use includes Snuff. She reports that she does not drink alcohol or use illicit drugs.  Past Medical History  Diagnosis Date  . ADD (attention deficit disorder)   . Depression   . Anxiety   . Smoker     3/4 PPD  . Chronic left shoulder pain   . Chronic neck pain   . DDD (degenerative disc disease), cervical   . Chronic right shoulder pain   . GERD (gastroesophageal reflux disease)   . Fibromyalgia     diag 12/13/14    Past Surgical History  Procedure Laterality Date  . Tubal ligation Bilateral 1992    Current Outpatient Prescriptions  Medication Sig Dispense Refill  . amphetamine-dextroamphetamine (ADDERALL) 30 MG tablet Take 30 mg by mouth daily.    Marland Kitchen escitalopram (LEXAPRO) 20 MG tablet Take 20 mg by mouth daily.    Marland Kitchen ibuprofen (ADVIL,MOTRIN) 200 MG tablet Take 600-800 mg by mouth every 6 (six) hours as needed for pain.    Marland Kitchen LORazepam (ATIVAN) 1 MG tablet Take 1 mg by mouth every 8 (eight) hours.    . traMADol-acetaminophen (ULTRACET) 37.5-325 MG per tablet Take 1 tablet by mouth every 8 (eight) hours as needed.     . Vitamin D,  Ergocalciferol, (DRISDOL) 50000 UNITS CAPS capsule Once weekly     No current facility-administered medications for this visit.    Family History  Problem Relation Age of Onset  . Bipolar disorder Mother   . Breast cancer Maternal Grandmother   . Diabetes Paternal Grandmother   . Hypertension Paternal Grandfather   . Alzheimer's disease Father   . Bipolar disorder Sister     ROS:  Pertinent items are noted in HPI.  Otherwise, a comprehensive ROS was negative.  Exam:   BP 160/88 mmHg  Pulse 56  Resp 25  Ht 5' 1.25" (1.556 m)  Wt 158 lb 12.8 oz (72.031 kg)  BMI 29.75 kg/m2  LMP 12/10/2014  Weight change: +13#  Height: 5' 1.25" (155.6 cm)  Ht Readings from Last 3 Encounters:  01/12/15 5' 1.25" (1.556 m)  08/25/14 5\' 1"  (1.549 m)  06/15/14 5\' 2"  (1.575 m)    General appearance: alert, cooperative and appears stated age Head: Normocephalic, without obvious abnormality, atraumatic Neck: no adenopathy, supple, symmetrical, trachea midline and thyroid normal to inspection and palpation Lungs: clear to auscultation bilaterally Breasts: normal appearance, no masses or tenderness Heart: regular rate and rhythm Abdomen: soft, non-tender; bowel sounds normal; no masses,  no organomegaly Extremities: extremities  normal, atraumatic, no cyanosis or edema Skin: Skin color, texture, turgor normal. No rashes or lesions Lymph nodes: Cervical, supraclavicular, and axillary nodes normal. No abnormal inguinal nodes palpated Neurologic: Grossly normal   Pelvic: External genitalia:  no lesions              Urethra:  normal appearing urethra with no masses, tenderness or lesions              Bartholins and Skenes: normal                 Vagina: normal appearing vagina with normal color and discharge, no lesions              Cervix: no lesions              Pap taken: No. Bimanual Exam:  Uterus:  enlarged, 10 weeks size              Adnexa: normal adnexa and no mass, fullness, tenderness                Rectovaginal: Confirms               Anus:  normal sphincter tone, no lesions  Chaperone was present for exam.  A:  Well Woman with normal exam H/O anxiety, depression DDD and bulging disc New diagnosis of fibromyalgia Fibroid uterus with h/o menorrhagia (neg endometrial biopsy 10/15) BP elevated today (pt took adderal just before appt).  Pt aware and will plan to discuss with Dr. Hilma Favors at appt later this month  P: Mammogram yearly pap smear 2015.  Neg HR HPV 2013 Labs with PCP return annually or prn

## 2015-12-27 ENCOUNTER — Telehealth: Payer: Self-pay | Admitting: *Deleted

## 2015-12-27 NOTE — Telephone Encounter (Signed)
Patient wanting to proceed with Hysterectomy Surgery. 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

## 2015-12-27 NOTE — Telephone Encounter (Signed)
Last seen for annual exam on 01-12-15. Return call to patient, left message to call back. Patient calls back immediately. Anxious to proceed with scheduling surgery this summer. Advised would need office visit to update history and re-evaluate plan prior to scheduling. Recommend move annual up and could discuss surgery/begin planning at that time. Annual scheduled for 01-14-16. Advised of surgery scheduling policies. Can begin considering dates and stop to see surgery scheduler after appointment with Dr Sabra Heck.   Routing to provider for final review. Patient agreeable to disposition. Will close encounter.

## 2015-12-27 NOTE — Telephone Encounter (Signed)
Routing to Lamont Snowball, RN for review and surgery scheduling.

## 2016-01-14 ENCOUNTER — Ambulatory Visit (INDEPENDENT_AMBULATORY_CARE_PROVIDER_SITE_OTHER): Payer: BC Managed Care – PPO | Admitting: Obstetrics & Gynecology

## 2016-01-14 ENCOUNTER — Encounter: Payer: Self-pay | Admitting: Obstetrics & Gynecology

## 2016-01-14 VITALS — BP 128/82 | HR 111 | Resp 14 | Ht 61.0 in | Wt 152.0 lb

## 2016-01-14 DIAGNOSIS — Z0000 Encounter for general adult medical examination without abnormal findings: Secondary | ICD-10-CM | POA: Diagnosis not present

## 2016-01-14 DIAGNOSIS — N852 Hypertrophy of uterus: Secondary | ICD-10-CM

## 2016-01-14 DIAGNOSIS — Z01419 Encounter for gynecological examination (general) (routine) without abnormal findings: Secondary | ICD-10-CM

## 2016-01-14 DIAGNOSIS — Z124 Encounter for screening for malignant neoplasm of cervix: Secondary | ICD-10-CM

## 2016-01-14 LAB — POCT URINALYSIS DIPSTICK
Bilirubin, UA: NEGATIVE
Blood, UA: NEGATIVE
Glucose, UA: NEGATIVE
KETONES UA: NEGATIVE
LEUKOCYTES UA: NEGATIVE
Nitrite, UA: NEGATIVE
PROTEIN UA: NEGATIVE
UROBILINOGEN UA: NEGATIVE
pH, UA: 6

## 2016-01-14 NOTE — Progress Notes (Signed)
50 y.o. [id number] DivorcedCaucasianF here for annual exam.  Doing well.  Getting married to significant other.  They've been together for three years.  Reports she continues to have pressure symptoms from her fibroids.  She is reporting more break through bleeding.  Bleeding has been heavy for several years.  Also, reports more back pain.  Would like to proceed with additional evaluation and discuss surgery if possible.  PCP:  Dr. Hilma Favors.  Has appt this morning and did blood work this morning.   Patient's last menstrual period was 12/26/2015.          Sexually active: Yes.    The current method of family planning is tubal ligation.    Exercising: Yes.    walking Smoker:  Yes 1/2 pack   Health Maintenance: Pap:  02/04/2014 negative  History of abnormal Pap:  no MMG: 01/09/2014 BIRADS 1 negative Colonoscopy:  none BMD:   none TDaP:  PCP Screening Labs: PCP , Hb today: PCP, Urine today: normal    reports that she has been smoking.  Her smokeless tobacco use includes Snuff. She reports that she does not drink alcohol or use illicit drugs.  Past Medical History  Diagnosis Date  . ADD (attention deficit disorder)   . Depression   . Anxiety   . Smoker     3/4 PPD  . Chronic left shoulder pain   . Chronic neck pain   . DDD (degenerative disc disease), cervical   . Chronic right shoulder pain   . GERD (gastroesophageal reflux disease)   . Fibromyalgia     diag 12/13/14    Past Surgical History  Procedure Laterality Date  . Tubal ligation Bilateral 1992    Current Outpatient Prescriptions  Medication Sig Dispense Refill  . amphetamine-dextroamphetamine (ADDERALL) 30 MG tablet Take 30 mg by mouth daily.    Marland Kitchen escitalopram (LEXAPRO) 20 MG tablet Take 20 mg by mouth daily.    Marland Kitchen ibuprofen (ADVIL,MOTRIN) 200 MG tablet Take 600-800 mg by mouth every 6 (six) hours as needed for pain.    Marland Kitchen LORazepam (ATIVAN) 1 MG tablet Take 1 mg by mouth every 8 (eight) hours.    . traMADol-acetaminophen  (ULTRACET) 37.5-325 MG per tablet Take 1 tablet by mouth every 8 (eight) hours as needed.     . Vitamin D, Ergocalciferol, (DRISDOL) 50000 UNITS CAPS capsule Once weekly     No current facility-administered medications for this visit.    Family History  Problem Relation Age of Onset  . Bipolar disorder Mother   . Breast cancer Maternal Grandmother   . Diabetes Paternal Grandmother   . Hypertension Paternal Grandfather   . Alzheimer's disease Father   . Bipolar disorder Sister     ROS:  Pertinent items are noted in HPI.  Otherwise, a comprehensive ROS was negative.  Exam:   BP 128/82 mmHg  Pulse 111  Resp 14  Ht 5\' 1"  (1.549 m)  Wt 152 lb (68.947 kg)  BMI 28.74 kg/m2  LMP 12/26/2015  Weight change: -6#  Height: 5\' 1"  (154.9 cm)  Ht Readings from Last 3 Encounters:  01/14/16 5\' 1"  (1.549 m)  01/12/15 5' 1.25" (1.556 m)  08/25/14 5\' 1"  (1.549 m)    General appearance: alert, cooperative and appears stated age Head: Normocephalic, without obvious abnormality, atraumatic Neck: no adenopathy, supple, symmetrical, trachea midline and thyroid normal to inspection and palpation Lungs: clear to auscultation bilaterally Breasts: normal appearance, no masses or tenderness Heart: regular rate and rhythm Abdomen:  soft, non-tender; bowel sounds normal; no masses,  no organomegaly Extremities: extremities normal, atraumatic, no cyanosis or edema Skin: Skin color, texture, turgor normal. No rashes or lesions Lymph nodes: Cervical, supraclavicular, and axillary nodes normal. No abnormal inguinal nodes palpated Neurologic: Grossly normal   Pelvic: External genitalia:  no lesions              Urethra:  normal appearing urethra with no masses, tenderness or lesions              Bartholins and Skenes: normal                 Vagina: normal appearing vagina with normal color and discharge, no lesions              Cervix: no lesions              Pap taken: Yes.   Bimanual Exam:  Uterus:   enlarged, 10 weeks size              Adnexa: normal adnexa and no mass, fullness, tenderness               Rectovaginal: Confirms               Anus:  normal sphincter tone, no lesions  Chaperone was present for exam.  A:  Well Woman with normal exam H/O anxiety, depression DDD and bulging disc Fibromyalgia Fibroid uterus with h/o menorrhagia (neg endometrial biopsy 10/15).  Symptomatic. H/O elevated blood pressures in my office.  Has had follow-up with PCP.  P: Mammogram yearly.  Pt aware this is overdue.  She will schedule.  She declines having [place] schedule this for her. Pap smear 2015. Neg HR HPV 2013.  Pap with HR HPV today. Labs with PCP this morning Return for PUS.  Will discuss additional recommendations at that time. Return annually or prn

## 2016-01-16 ENCOUNTER — Other Ambulatory Visit: Payer: Self-pay | Admitting: Obstetrics & Gynecology

## 2016-01-16 LAB — IPS PAP TEST WITH HPV

## 2016-01-16 NOTE — Telephone Encounter (Signed)
Called patient to review benefits for a recommended procedure. Left Voicemail requesting a call back. °

## 2016-01-16 NOTE — Telephone Encounter (Signed)
Patient returned Sally's call she said she will try and call back tomorrow after 9.

## 2016-01-17 NOTE — Telephone Encounter (Signed)
Notes Recorded by Matthew Folks, RN on 01/17/2016 at 11:46 AM Called patient and left voicemail to have patient call back Notes Recorded by Megan Salon, MD on 01/16/2016 at 4:03 PM 02 recall. Please let pt know that Pap was normal and HR HPV testing was negative. Yeast and BV were also noted on the chart. As we are planning surgery, I would recommend treating both. I have not sent in an order as she has not been called yet. Ok to treat with flagyl 500mg  bid x 7 days and Diflucan 150mg  po x 1.

## 2016-01-21 MED ORDER — FLUCONAZOLE 150 MG PO TABS: 150.0000 mg | ORAL_TABLET | Freq: Once | ORAL | Status: DC

## 2016-01-21 MED ORDER — METRONIDAZOLE 500 MG PO TABS: 500.0000 mg | ORAL_TABLET | Freq: Two times a day (BID) | ORAL | Status: DC

## 2016-01-21 NOTE — Telephone Encounter (Signed)
Patient returned call regarding lab results. 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 Patient gives permission and requests details on her voicemail if unable to reach.  Patient verified cell number: [phone]  Patient also provided deposit for surgery and is ready to move forward with scheduling.   Routing message to [employer] for labs and Gay Filler for surgery scheduling.

## 2016-01-21 NOTE — Telephone Encounter (Signed)
Called patient and left voicemail per dpr, of results of pap smear. Also informed pt that yeast and BV were noted and needed treatment. Informed patient I would call those in to the pharmacy she had listed on profile.

## 2016-01-22 ENCOUNTER — Telehealth: Payer: Self-pay | Admitting: *Deleted

## 2016-01-22 NOTE — Telephone Encounter (Signed)
Call to patient to confirm surgery date of 02-25-16.

## 2016-01-31 ENCOUNTER — Ambulatory Visit (INDEPENDENT_AMBULATORY_CARE_PROVIDER_SITE_OTHER): Payer: BC Managed Care – PPO

## 2016-01-31 ENCOUNTER — Encounter: Payer: Self-pay | Admitting: Obstetrics & Gynecology

## 2016-01-31 ENCOUNTER — Ambulatory Visit (INDEPENDENT_AMBULATORY_CARE_PROVIDER_SITE_OTHER): Payer: BC Managed Care – PPO | Admitting: Obstetrics & Gynecology

## 2016-01-31 VITALS — BP 142/80 | HR 88 | Resp 18 | Wt 152.0 lb

## 2016-01-31 DIAGNOSIS — N920 Excessive and frequent menstruation with regular cycle: Secondary | ICD-10-CM | POA: Diagnosis not present

## 2016-01-31 DIAGNOSIS — N852 Hypertrophy of uterus: Secondary | ICD-10-CM

## 2016-01-31 DIAGNOSIS — D251 Intramural leiomyoma of uterus: Secondary | ICD-10-CM

## 2016-01-31 MED ORDER — IBUPROFEN 800 MG PO TABS: 800.0000 mg | ORAL_TABLET | Freq: Three times a day (TID) | ORAL | Status: DC | PRN

## 2016-01-31 NOTE — Telephone Encounter (Signed)
Call to patient, left message to call back. Calling to review surgical instructions.

## 2016-01-31 NOTE — Progress Notes (Signed)
50 y.o. [id number] Divorced Caucasian female here for pelvic ultrasound due to uterine fibroids, menorrhagia, and for surgery planning.  Pt has considered hysterectomy for several years.  She is ready to proceed at this time.    Patient's last menstrual period was 01/28/2016.  Sexually active:  yes  Contraception: bilateral tubal ligation  FINDINGS: UTERUS: 8.8 x 7.1 x 4.8cm with 4.5cm intramural fibroid EMS: 6.55mm ADNEXA:   Left ovary 2.5 x 1.5 x 1.5cm   Right ovary 2.4 x 1.9 x 1.5cm.  Bilateral follicles seen. CUL DE SAC: no free fluid  Last endometrial biopsy was 10/15: negative  Ob Hx:   Patient's last menstrual period was 01/28/2016.          Sexually active: Yes.   Birth control: bilateral tubal ligation Last pap: neg with neg HR HPV 5/17 Last MMG: 01/23/16 per pt.  Has gotten notification that this was normal Tobacco: stopped smoked 3 days ago.  On Chantix.  Past Surgical History  Procedure Laterality Date  . Tubal ligation Bilateral 1992    Past Medical History  Diagnosis Date  . ADD (attention deficit disorder)   . Depression   . Anxiety   . Smoker     3/4 PPD  . Chronic left shoulder pain   . Chronic neck pain   . DDD (degenerative disc disease), cervical   . Chronic right shoulder pain   . GERD (gastroesophageal reflux disease)   . Fibromyalgia     diag 12/13/14    Allergies: Lyrica and Codeine  Current Outpatient Prescriptions  Medication Sig Dispense Refill  . amphetamine-dextroamphetamine (ADDERALL) 30 MG tablet Take 30 mg by mouth daily.    . CHANTIX STARTING MONTH PAK 0.5 MG X 11 & 1 MG X 42 tablet Take 1 mg by mouth 2 (two) times daily.    Marland Kitchen escitalopram (LEXAPRO) 20 MG tablet Take 20 mg by mouth daily.    Marland Kitchen ibuprofen (ADVIL,MOTRIN) 200 MG tablet Take 600-800 mg by mouth every 6 (six) hours as needed for pain.    Marland Kitchen LORazepam (ATIVAN) 1 MG tablet Take 1 mg by mouth every 8 (eight) hours.    . traMADol-acetaminophen (ULTRACET) 37.5-325 MG per tablet Take 1  tablet by mouth every 8 (eight) hours as needed.      No current facility-administered medications for this visit.    ROS: A comprehensive review of systems was negative.  Exam:    BP 142/80 mmHg  Pulse 88  Resp 18  Wt 152 lb (68.947 kg)  LMP 01/28/2016  General appearance: alert and cooperative Head: Normocephalic, without obvious abnormality, atraumatic Neck: no adenopathy, supple, symmetrical, trachea midline and thyroid not enlarged, symmetric, no tenderness/mass/nodules Lungs: clear to auscultation bilaterally Heart: regular rate and rhythm, S1, S2 normal, no murmur, click, rub or gallop Abdomen: soft, non-tender; bowel sounds normal; no masses,  no organomegaly Extremities: extremities normal, atraumatic, no cyanosis or edema Skin: Skin color, texture, turgor normal. No rashes or lesions Lymph nodes: Cervical, supraclavicular, and axillary nodes normal. no inguinal nodes palpated Neurologic: Grossly normal  Pelvic: A: Enlarged uterus 4.5cm intramural fibroid Menorrhagia  P:   TLH, bilateral salpingectomy, possible BSO, cystoscopy planned Rx for Motrin.  Will have to adjust pain medications due to side effects from previous narcotics Medications/Vitamins reviewed.  Pt knows needs to stop any ASA products. Hysterectomy brochure given for pre and post op instructions.   ~25 minutes spent with patient >50% of time was in face to face discussion of above.

## 2016-02-07 ENCOUNTER — Encounter: Payer: Self-pay | Admitting: Obstetrics & Gynecology

## 2016-02-20 NOTE — Patient Instructions (Addendum)
Your procedure is scheduled on: Monday, June 19  Enter through the Main Entrance of [facility] at: 9:25 am  Pick up the phone at the desk and dial [phone].  Call this number if you have problems the morning of surgery: [phone].  Remember: Do NOT eat or drink after midnight Sunday, 6/18  Take these medicines the morning of surgery with a SIP OF WATER:  Ativan if needed  Do NOT wear jewelry (body piercing), metal hair clips/bobby pins, make-up, or nail polish. Do NOT wear lotions, powders, or perfumes.  You may wear deoderant. Do NOT shave for 48 hours prior to surgery. Do NOT bring valuables to the hospital.  Leave suitcase in car.  After surgery it may be brought to your room.  For patients admitted to the hospital, checkout time is 11:00 AM the day of discharge. Home with fiance Terence cell [phone].

## 2016-02-21 ENCOUNTER — Encounter (HOSPITAL_COMMUNITY)
Admission: RE | Admit: 2016-02-21 | Discharge: 2016-02-21 | Disposition: A | Discharge status: Home / Self Care | Payer: BC Managed Care – PPO | Source: Ambulatory Visit | Attending: Obstetrics & Gynecology | Admitting: Obstetrics & Gynecology

## 2016-02-21 ENCOUNTER — Encounter (HOSPITAL_COMMUNITY): Payer: Self-pay

## 2016-02-21 DIAGNOSIS — Z01812 Encounter for preprocedural laboratory examination: Secondary | ICD-10-CM | POA: Diagnosis present

## 2016-02-21 HISTORY — DX: Sleep apnea, unspecified: G47.30

## 2016-02-21 LAB — CBC
HCT: 38.5 % (ref 36.0–46.0)
Hemoglobin: 13.2 g/dL (ref 12.0–15.0)
MCH: 30.3 pg (ref 26.0–34.0)
MCHC: 34.3 g/dL (ref 30.0–36.0)
MCV: 88.5 fL (ref 78.0–100.0)
PLATELETS: 410 10*3/uL — AB (ref 150–400)
RBC: 4.35 MIL/uL (ref 3.87–5.11)
RDW: 13.4 % (ref 11.5–15.5)
WBC: 8.3 10*3/uL (ref 4.0–10.5)

## 2016-02-22 ENCOUNTER — Telehealth: Payer: Self-pay | Admitting: Obstetrics & Gynecology

## 2016-02-22 NOTE — Telephone Encounter (Signed)
Call to patient to review instructions for surgery on Monday 02-25-16. Left message to call back.

## 2016-02-22 NOTE — Telephone Encounter (Signed)
Spoke with patient. Patient states she has a dentist appointment at the same time of her 1 week post op appointment. Asking to reschedule her appointment at this time. Appointment for 6/27 cancelled. Rescheduled for 03/06/2016 at 10 am with Dr.Miller. Reports she feels well and does not have any concerns at this time.  Routing to covering provider for final review. Patient agreeable to disposition. Will close encounter.

## 2016-02-22 NOTE — Telephone Encounter (Signed)
Patient says she need to reschedule her 1 week po with Dr. Sabra Heck.

## 2016-02-22 NOTE — Telephone Encounter (Signed)
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 Surgery instruction sheet reviewed and post op appointment scheduled.  Routing to provider for final review. Patient agreeable to disposition. Will close encounter.   '

## 2016-02-24 MED ORDER — DEXTROSE 5 % IV SOLN
2.0000 g | INTRAVENOUS | Status: AC
  Administered 2016-02-25: 2 g via INTRAVENOUS
  Filled 2016-02-24: qty 2

## 2016-02-25 ENCOUNTER — Ambulatory Visit (HOSPITAL_COMMUNITY): Payer: BC Managed Care – PPO | Admitting: Anesthesiology

## 2016-02-25 ENCOUNTER — Other Ambulatory Visit: Payer: Self-pay | Admitting: Obstetrics & Gynecology

## 2016-02-25 ENCOUNTER — Encounter (HOSPITAL_COMMUNITY)
Admission: RE | Disposition: A | Discharge status: Home / Self Care | Payer: Self-pay | Source: Ambulatory Visit | Attending: Obstetrics & Gynecology

## 2016-02-25 ENCOUNTER — Ambulatory Visit (HOSPITAL_COMMUNITY)
Admission: RE | Admit: 2016-02-25 | Discharge: 2016-02-26 | Disposition: A | Discharge status: Home / Self Care | Payer: BC Managed Care – PPO | Source: Ambulatory Visit | Attending: Obstetrics & Gynecology | Admitting: Obstetrics & Gynecology

## 2016-02-25 ENCOUNTER — Encounter (HOSPITAL_COMMUNITY): Payer: Self-pay | Admitting: *Deleted

## 2016-02-25 DIAGNOSIS — N879 Dysplasia of cervix uteri, unspecified: Secondary | ICD-10-CM | POA: Diagnosis not present

## 2016-02-25 DIAGNOSIS — F329 Major depressive disorder, single episode, unspecified: Secondary | ICD-10-CM | POA: Diagnosis not present

## 2016-02-25 DIAGNOSIS — F419 Anxiety disorder, unspecified: Secondary | ICD-10-CM | POA: Diagnosis not present

## 2016-02-25 DIAGNOSIS — M797 Fibromyalgia: Secondary | ICD-10-CM | POA: Diagnosis not present

## 2016-02-25 DIAGNOSIS — Z23 Encounter for immunization: Secondary | ICD-10-CM | POA: Diagnosis not present

## 2016-02-25 DIAGNOSIS — N920 Excessive and frequent menstruation with regular cycle: Secondary | ICD-10-CM | POA: Diagnosis not present

## 2016-02-25 DIAGNOSIS — M5031 Other cervical disc degeneration,  high cervical region: Secondary | ICD-10-CM | POA: Insufficient documentation

## 2016-02-25 DIAGNOSIS — Z885 Allergy status to narcotic agent status: Secondary | ICD-10-CM | POA: Insufficient documentation

## 2016-02-25 DIAGNOSIS — D259 Leiomyoma of uterus, unspecified: Secondary | ICD-10-CM | POA: Diagnosis not present

## 2016-02-25 DIAGNOSIS — N72 Inflammatory disease of cervix uteri: Secondary | ICD-10-CM | POA: Insufficient documentation

## 2016-02-25 DIAGNOSIS — N803 Endometriosis of pelvic peritoneum: Secondary | ICD-10-CM | POA: Diagnosis not present

## 2016-02-25 DIAGNOSIS — G4730 Sleep apnea, unspecified: Secondary | ICD-10-CM | POA: Diagnosis not present

## 2016-02-25 DIAGNOSIS — F988 Other specified behavioral and emotional disorders with onset usually occurring in childhood and adolescence: Secondary | ICD-10-CM | POA: Diagnosis not present

## 2016-02-25 DIAGNOSIS — D251 Intramural leiomyoma of uterus: Secondary | ICD-10-CM | POA: Insufficient documentation

## 2016-02-25 DIAGNOSIS — F17210 Nicotine dependence, cigarettes, uncomplicated: Secondary | ICD-10-CM | POA: Diagnosis not present

## 2016-02-25 DIAGNOSIS — Z888 Allergy status to other drugs, medicaments and biological substances status: Secondary | ICD-10-CM | POA: Diagnosis not present

## 2016-02-25 DIAGNOSIS — N808 Other endometriosis: Secondary | ICD-10-CM | POA: Insufficient documentation

## 2016-02-25 DIAGNOSIS — R102 Pelvic and perineal pain: Secondary | ICD-10-CM | POA: Diagnosis not present

## 2016-02-25 HISTORY — PX: CYSTOSCOPY: SHX5120

## 2016-02-25 HISTORY — PX: LAPAROSCOPIC HYSTERECTOMY: SHX1926

## 2016-02-25 HISTORY — PX: BILATERAL SALPINGECTOMY: SHX5743

## 2016-02-25 LAB — PREGNANCY, URINE: Preg Test, Ur: NEGATIVE

## 2016-02-25 SURGERY — HYSTERECTOMY, TOTAL, LAPAROSCOPIC
Anesthesia: General

## 2016-02-25 MED ORDER — SCOPOLAMINE 1 MG/3DAYS TD PT72
1.0000 | MEDICATED_PATCH | Freq: Once | TRANSDERMAL | Status: DC
  Administered 2016-02-25: 1.5 mg via TRANSDERMAL

## 2016-02-25 MED ORDER — NICOTINE 14 MG/24HR TD PT24
14.0000 mg | MEDICATED_PATCH | Freq: Every day | TRANSDERMAL | Status: DC
  Administered 2016-02-25 – 2016-02-26 (×2): 14 mg via TRANSDERMAL
  Filled 2016-02-25 (×3): qty 1

## 2016-02-25 MED ORDER — SODIUM CHLORIDE 0.9 % IJ SOLN
INTRAMUSCULAR | Status: AC
  Filled 2016-02-25: qty 50

## 2016-02-25 MED ORDER — SUGAMMADEX SODIUM 200 MG/2ML IV SOLN
INTRAVENOUS | Status: DC | PRN
  Administered 2016-02-25: 150 mg via INTRAVENOUS

## 2016-02-25 MED ORDER — ALUM & MAG HYDROXIDE-SIMETH 200-200-20 MG/5ML PO SUSP: 30.0000 mL | ORAL | Status: DC | PRN

## 2016-02-25 MED ORDER — ENOXAPARIN SODIUM 150 MG/ML ~~LOC~~ SOLN: 40.0000 mg | SUBCUTANEOUS | Status: DC

## 2016-02-25 MED ORDER — KETOROLAC TROMETHAMINE 30 MG/ML IJ SOLN
INTRAMUSCULAR | Status: AC
Start: 2016-02-25 — End: 2016-02-25
  Filled 2016-02-25: qty 1

## 2016-02-25 MED ORDER — FENTANYL CITRATE (PF) 250 MCG/5ML IJ SOLN
INTRAMUSCULAR | Status: AC
  Filled 2016-02-25: qty 5

## 2016-02-25 MED ORDER — ROPIVACAINE HCL 5 MG/ML IJ SOLN
INTRAMUSCULAR | Status: AC
  Filled 2016-02-25: qty 30

## 2016-02-25 MED ORDER — SCOPOLAMINE 1 MG/3DAYS TD PT72
MEDICATED_PATCH | TRANSDERMAL | Status: AC
  Filled 2016-02-25: qty 1

## 2016-02-25 MED ORDER — PNEUMOCOCCAL VAC POLYVALENT 25 MCG/0.5ML IJ INJ
0.5000 mL | INJECTION | INTRAMUSCULAR | Status: AC
  Administered 2016-02-26: 0.5 mL via INTRAMUSCULAR
  Filled 2016-02-25: qty 0.5

## 2016-02-25 MED ORDER — KETOROLAC TROMETHAMINE 30 MG/ML IJ SOLN
30.0000 mg | Freq: Four times a day (QID) | INTRAMUSCULAR | Status: DC
  Administered 2016-02-25 – 2016-02-26 (×3): 30 mg via INTRAVENOUS
  Filled 2016-02-25 (×3): qty 1

## 2016-02-25 MED ORDER — STERILE WATER FOR IRRIGATION IR SOLN
Status: DC | PRN
  Administered 2016-02-25: 3000 mL

## 2016-02-25 MED ORDER — MIDAZOLAM HCL 2 MG/2ML IJ SOLN
INTRAMUSCULAR | Status: DC | PRN
  Administered 2016-02-25: 2 mg via INTRAVENOUS

## 2016-02-25 MED ORDER — KETOROLAC TROMETHAMINE 30 MG/ML IJ SOLN
INTRAMUSCULAR | Status: DC | PRN
  Administered 2016-02-25: 30 mg via INTRAVENOUS

## 2016-02-25 MED ORDER — MIDAZOLAM HCL 2 MG/2ML IJ SOLN
INTRAMUSCULAR | Status: AC
  Filled 2016-02-25: qty 2

## 2016-02-25 MED ORDER — HYDROMORPHONE HCL 1 MG/ML IJ SOLN
0.5000 mg | INTRAMUSCULAR | Status: DC | PRN
  Administered 2016-02-25: 0.5 mg via INTRAVENOUS
  Filled 2016-02-25: qty 1

## 2016-02-25 MED ORDER — DEXAMETHASONE SODIUM PHOSPHATE 10 MG/ML IJ SOLN
INTRAMUSCULAR | Status: AC
  Filled 2016-02-25: qty 1

## 2016-02-25 MED ORDER — MENTHOL 3 MG MT LOZG: 1.0000 | LOZENGE | OROMUCOSAL | Status: DC | PRN

## 2016-02-25 MED ORDER — ONDANSETRON HCL 4 MG/2ML IJ SOLN
INTRAMUSCULAR | Status: AC
  Filled 2016-02-25: qty 2

## 2016-02-25 MED ORDER — ENOXAPARIN SODIUM 40 MG/0.4ML ~~LOC~~ SOLN
40.0000 mg | Freq: Once | SUBCUTANEOUS | Status: DC
  Filled 2016-02-25: qty 0.4

## 2016-02-25 MED ORDER — LIDOCAINE-EPINEPHRINE 1 %-1:100000 IJ SOLN
INTRAMUSCULAR | Status: AC
  Filled 2016-02-25: qty 1

## 2016-02-25 MED ORDER — ENOXAPARIN SODIUM 40 MG/0.4ML ~~LOC~~ SOLN
40.0000 mg | SUBCUTANEOUS | Status: AC
  Administered 2016-02-25: 40 mg via SUBCUTANEOUS
  Filled 2016-02-25: qty 0.4

## 2016-02-25 MED ORDER — DEXTROSE-NACL 5-0.45 % IV SOLN
INTRAVENOUS | Status: DC
  Administered 2016-02-25 – 2016-02-26 (×3): via INTRAVENOUS

## 2016-02-25 MED ORDER — FENTANYL CITRATE (PF) 100 MCG/2ML IJ SOLN
INTRAMUSCULAR | Status: DC | PRN
  Administered 2016-02-25: 25 ug via INTRAVENOUS
  Administered 2016-02-25 (×3): 50 ug via INTRAVENOUS
  Administered 2016-02-25: 25 ug via INTRAVENOUS
  Administered 2016-02-25: 50 ug via INTRAVENOUS

## 2016-02-25 MED ORDER — ACETAMINOPHEN 10 MG/ML IV SOLN
1000.0000 mg | Freq: Once | INTRAVENOUS | Status: AC
  Administered 2016-02-25: 1000 mg via INTRAVENOUS
  Filled 2016-02-25: qty 100

## 2016-02-25 MED ORDER — SIMETHICONE 80 MG PO CHEW
80.0000 mg | CHEWABLE_TABLET | Freq: Four times a day (QID) | ORAL | Status: DC | PRN
  Administered 2016-02-26: 80 mg via ORAL
  Filled 2016-02-25: qty 1

## 2016-02-25 MED ORDER — HYDROMORPHONE HCL 1 MG/ML IJ SOLN
INTRAMUSCULAR | Status: AC
  Administered 2016-02-25: 0.5 mg via INTRAVENOUS
  Filled 2016-02-25: qty 1

## 2016-02-25 MED ORDER — SODIUM CHLORIDE 0.9 % IV SOLN
INTRAVENOUS | Status: DC | PRN
  Administered 2016-02-25: 60 mL

## 2016-02-25 MED ORDER — KETOROLAC TROMETHAMINE 30 MG/ML IJ SOLN: 30.0000 mg | Freq: Four times a day (QID) | INTRAMUSCULAR | Status: DC

## 2016-02-25 MED ORDER — FAMOTIDINE IN NACL 20-0.9 MG/50ML-% IV SOLN
20.0000 mg | Freq: Two times a day (BID) | INTRAVENOUS | Status: DC
  Administered 2016-02-25 – 2016-02-26 (×2): 20 mg via INTRAVENOUS
  Filled 2016-02-25 (×3): qty 50

## 2016-02-25 MED ORDER — DEXAMETHASONE SODIUM PHOSPHATE 10 MG/ML IJ SOLN
INTRAMUSCULAR | Status: DC | PRN
  Administered 2016-02-25: 10 mg via INTRAVENOUS

## 2016-02-25 MED ORDER — ROCURONIUM BROMIDE 100 MG/10ML IV SOLN
INTRAVENOUS | Status: AC
  Filled 2016-02-25: qty 1

## 2016-02-25 MED ORDER — ACETAMINOPHEN 325 MG PO TABS: 650.0000 mg | ORAL_TABLET | ORAL | Status: DC | PRN

## 2016-02-25 MED ORDER — LACTATED RINGERS IV SOLN
INTRAVENOUS | Status: DC
  Administered 2016-02-25: 11:00:00 via INTRAVENOUS
  Administered 2016-02-25: 1000 mL via INTRAVENOUS

## 2016-02-25 MED ORDER — LIDOCAINE HCL (CARDIAC) 20 MG/ML IV SOLN
INTRAVENOUS | Status: AC
  Filled 2016-02-25: qty 5

## 2016-02-25 MED ORDER — SUGAMMADEX SODIUM 200 MG/2ML IV SOLN
INTRAVENOUS | Status: AC
  Filled 2016-02-25: qty 2

## 2016-02-25 MED ORDER — LIDOCAINE HCL (CARDIAC) 20 MG/ML IV SOLN
INTRAVENOUS | Status: DC | PRN
  Administered 2016-02-25: 100 mg via INTRAVENOUS

## 2016-02-25 MED ORDER — ONDANSETRON HCL 4 MG/2ML IJ SOLN
INTRAMUSCULAR | Status: DC | PRN
  Administered 2016-02-25: 4 mg via INTRAVENOUS

## 2016-02-25 MED ORDER — SODIUM CHLORIDE 0.9 % IJ SOLN
INTRAMUSCULAR | Status: DC | PRN
  Administered 2016-02-25: 10 mL

## 2016-02-25 MED ORDER — MEPERIDINE HCL 25 MG/ML IJ SOLN: 6.2500 mg | INTRAMUSCULAR | Status: DC | PRN

## 2016-02-25 MED ORDER — PROPOFOL 10 MG/ML IV BOLUS
INTRAVENOUS | Status: DC | PRN
  Administered 2016-02-25: 200 mg via INTRAVENOUS

## 2016-02-25 MED ORDER — ROCURONIUM BROMIDE 100 MG/10ML IV SOLN
INTRAVENOUS | Status: DC | PRN
  Administered 2016-02-25: 40 mg via INTRAVENOUS
  Administered 2016-02-25 (×2): 10 mg via INTRAVENOUS

## 2016-02-25 MED ORDER — PROMETHAZINE HCL 25 MG/ML IJ SOLN: 6.2500 mg | INTRAMUSCULAR | Status: DC | PRN

## 2016-02-25 MED ORDER — OXYCODONE-ACETAMINOPHEN 5-325 MG PO TABS
1.0000 | ORAL_TABLET | ORAL | Status: DC | PRN
  Administered 2016-02-26: 1 via ORAL
  Filled 2016-02-25: qty 1

## 2016-02-25 MED ORDER — HYDROMORPHONE HCL 1 MG/ML IJ SOLN
0.2500 mg | INTRAMUSCULAR | Status: DC | PRN
  Administered 2016-02-25 (×2): 0.5 mg via INTRAVENOUS

## 2016-02-25 MED ORDER — PROPOFOL 10 MG/ML IV BOLUS
INTRAVENOUS | Status: AC
  Filled 2016-02-25: qty 20

## 2016-02-25 SURGICAL SUPPLY — 58 items
APL SKNCLS STERI-STRIP NONHPOA (GAUZE/BANDAGES/DRESSINGS)
APL SRG 38 LTWT LNG FL B (MISCELLANEOUS)
APPLICATOR ARISTA FLEXITIP XL (MISCELLANEOUS) IMPLANT
BENZOIN TINCTURE PRP APPL 2/3 (GAUZE/BANDAGES/DRESSINGS) IMPLANT
BLADE SURG 15 STRL LF C SS BP (BLADE) IMPLANT
BLADE SURG 15 STRL SS (BLADE)
CABLE HIGH FREQUENCY MONO STRZ (ELECTRODE) IMPLANT
CLOTH BEACON ORANGE TIMEOUT ST (SAFETY) ×3 IMPLANT
COVER BACK TABLE 60X90IN (DRAPES) ×3 IMPLANT
COVER LIGHT HANDLE  1/PK (MISCELLANEOUS) ×1
COVER LIGHT HANDLE 1/PK (MISCELLANEOUS) ×2 IMPLANT
DISSECTOR BLUNT TIP ENDO 5MM (MISCELLANEOUS) IMPLANT
DURAPREP 26ML APPLICATOR (WOUND CARE) ×3 IMPLANT
EVACUATOR SMOKE 8.L (FILTER) ×4 IMPLANT
GLOVE BIOGEL PI IND STRL 7.0 (GLOVE) ×10 IMPLANT
GLOVE BIOGEL PI INDICATOR 7.0 (GLOVE) ×5
GLOVE ECLIPSE 6.5 STRL STRAW (GLOVE) ×6 IMPLANT
GOWN STRL REUS W/TWL LRG LVL3 (GOWN DISPOSABLE) ×6 IMPLANT
GOWN STRL REUS W/TWL XL LVL3 (GOWN DISPOSABLE) ×3 IMPLANT
LIGASURE BLUNT 5MM 37CM (INSTRUMENTS) ×3 IMPLANT
NEEDLE INSUFFLATION 120MM (ENDOMECHANICALS) ×3 IMPLANT
NS IRRIG 1000ML POUR BTL (IV SOLUTION) ×3 IMPLANT
OCCLUDER COLPOPNEUMO (BALLOONS) ×3 IMPLANT
PACK LAPAROSCOPY BASIN (CUSTOM PROCEDURE TRAY) ×3 IMPLANT
PAD TRENDELENBURG POSITION (MISCELLANEOUS) ×3 IMPLANT
POUCH LAPAROSCOPIC INSTRUMENT (MISCELLANEOUS) ×3 IMPLANT
SCISSORS LAP 5X35 DISP (ENDOMECHANICALS) IMPLANT
SET CYSTO W/LG BORE CLAMP LF (SET/KITS/TRAYS/PACK) ×1 IMPLANT
SET IRRIG TUBING LAPAROSCOPIC (IRRIGATION / IRRIGATOR) ×3 IMPLANT
SET TRI-LUMEN FLTR TB AIRSEAL (TUBING) ×3 IMPLANT
SHEARS HARMONIC ACE PLUS 36CM (ENDOMECHANICALS) ×1 IMPLANT
SLEEVE XCEL OPT CAN 5 100 (ENDOMECHANICALS) ×4 IMPLANT
SOLUTION ELECTROLUBE (MISCELLANEOUS) ×3 IMPLANT
SUT DVC VLOC 180 2-0 12IN GS21 (SUTURE)
SUT VIC AB 0 CT1 27 (SUTURE) ×6
SUT VIC AB 0 CT1 27XBRD ANBCTR (SUTURE) ×4 IMPLANT
SUT VIC AB 0 CT1 36 (SUTURE) IMPLANT
SUT VIC AB 3-0 PS2 18 (SUTURE)
SUT VIC AB 3-0 PS2 18XBRD (SUTURE) IMPLANT
SUT VICRYL 0 UR6 27IN ABS (SUTURE) IMPLANT
SUT VLOC 180 0 9IN  GS21 (SUTURE) ×1
SUT VLOC 180 0 9IN GS21 (SUTURE) IMPLANT
SUTURE DVC VL 180 2-0 12INGS21 (SUTURE) IMPLANT
SYR 50ML LL SCALE MARK (SYRINGE) ×3 IMPLANT
SYSTEM CARTER THOMASON II (TROCAR) ×3 IMPLANT
TIP UTERINE 5.1X6CM LAV DISP (MISCELLANEOUS) IMPLANT
TIP UTERINE 6.7X10CM GRN DISP (MISCELLANEOUS) IMPLANT
TIP UTERINE 6.7X6CM WHT DISP (MISCELLANEOUS) IMPLANT
TIP UTERINE 6.7X8CM BLUE DISP (MISCELLANEOUS) ×1 IMPLANT
TOWEL OR 17X24 6PK STRL BLUE (TOWEL DISPOSABLE) ×6 IMPLANT
TRAY FOLEY CATH SILVER 14FR (SET/KITS/TRAYS/PACK) ×3 IMPLANT
TROCAR BALLN 12MMX100 BLUNT (TROCAR) IMPLANT
TROCAR PORT AIRSEAL 5X120 (TROCAR) IMPLANT
TROCAR PORT AIRSEAL 8X120 (TROCAR) ×3 IMPLANT
TROCAR XCEL NON-BLD 11X100MML (ENDOMECHANICALS) IMPLANT
TROCAR XCEL NON-BLD 5MMX100MML (ENDOMECHANICALS) ×3 IMPLANT
WARMER LAPAROSCOPE (MISCELLANEOUS) ×3 IMPLANT
WATER STERILE IRR 1000ML POUR (IV SOLUTION) ×2 IMPLANT

## 2016-02-25 NOTE — Anesthesia Postprocedure Evaluation (Signed)
Anesthesia Post Note  Patient: SHONICE CURLING  Procedure(s) Performed: Procedure(s) (LRB): HYSTERECTOMY TOTAL LAPAROSCOPIC (N/A) BILATERAL SALPINGECTOMY (Bilateral) CYSTOSCOPY (N/A)  Patient location during evaluation: PACU Anesthesia Type: General Level of consciousness: awake and alert Pain management: pain level controlled Vital Signs Assessment: post-procedure vital signs reviewed and stable Respiratory status: spontaneous breathing, nonlabored ventilation, respiratory function stable and patient connected to nasal cannula oxygen Cardiovascular status: blood pressure returned to baseline and stable Postop Assessment: no signs of nausea or vomiting Anesthetic complications: no     Last Vitals:  Filed Vitals:   02/25/16 1400 02/25/16 1415  BP: 138/88   Pulse: 71   Temp:  36.8 C  Resp: 15 16    Last Pain:  Filed Vitals:   02/25/16 1418  PainSc: 3    Pain Goal: Patients Stated Pain Goal: 3 (02/25/16 0934)               Alexarae Oliva J

## 2016-02-25 NOTE — H&P (Signed)
Robin Dawson is an 50 y.o. female G3P3 MWF here for definitive treatment of symptomatic uterine fibroids.  Her biggest complaint is menorrhagia and pelvic fullness.  She has been counseled about risks and benefits and has been counseled about alternatives.  Pt is here and ready to proceed.   Pertinent Gynecological History: Menses: regular but heavy Contraception: tubal ligation DES exposure: denies Blood transfusions: none Sexually transmitted diseases: no past history Previous GYN Procedures: none  Last mammogram: normal Date: 5/17 Last pap: normal Date: 5/17 OB History: G3, P3   Past Medical History  Diagnosis Date  . ADD (attention deficit disorder)   . Depression   . Anxiety   . Smoker     3/4 PPD  . Chronic left shoulder pain     DDD  . Chronic neck pain     DDD C3-4  . Chronic right shoulder pain     DDD  . Fibromyalgia     diag 12/13/14  . GERD (gastroesophageal reflux disease)     no meds, diet controlled  . DDD (degenerative disc disease), cervical     C3-4  . Sleep apnea     does not use CPAP   . SVD (spontaneous vaginal delivery)     x 2    Past Surgical History  Procedure Laterality Date  . Wisdom tooth extraction    . Tubal ligation Bilateral 1992    [facility]    Family History  Problem Relation Age of Onset  . Bipolar disorder Mother   . Breast cancer Maternal Grandmother   . Diabetes Paternal Grandmother   . Hypertension Paternal Grandfather   . Alzheimer's disease Father   . Bipolar disorder Sister     Social History:  reports that she has been smoking.  She uses smokeless tobacco. She reports that she does not drink alcohol or use illicit drugs.  Allergies:  Allergies  Allergen Reactions  . Lyrica [Pregabalin]     Oral blisters, leg swelling  . Codeine Itching, Nausea And Vomiting and Rash    Prescriptions prior to admission  Medication Sig Dispense Refill Last Dose  . amphetamine-dextroamphetamine (ADDERALL) 30 MG tablet Take 30  mg by mouth daily.   02/24/2016 at Unknown time  . CHANTIX STARTING MONTH PAK 0.5 MG X 11 & 1 MG X 42 tablet Take 1 mg by mouth 2 (two) times daily.   Past Week at Unknown time  . diphenhydrAMINE (BENADRYL) 25 MG tablet Take 50 mg by mouth every 6 (six) hours as needed (for ears draining).    Past Week at Unknown time  . escitalopram (LEXAPRO) 20 MG tablet Take 20 mg by mouth at bedtime.    02/24/2016 at Unknown time  . ibuprofen (ADVIL,MOTRIN) 800 MG tablet Take 1 tablet (800 mg total) by mouth every 8 (eight) hours as needed. 30 tablet 0 Past Week at Unknown time  . LORazepam (ATIVAN) 1 MG tablet Take 1 mg by mouth every 8 (eight) hours as needed for anxiety.    02/25/2016 at Unknown time    Review of Systems  All other systems reviewed and are negative.   Blood pressure 122/89, pulse 74, temperature 98.1 F (36.7 C), temperature source Oral, resp. rate 16, SpO2 97 %. Physical Exam  Constitutional: She is oriented to person, place, and time. She appears well-developed and well-nourished.  Cardiovascular: Normal rate and regular rhythm.   Respiratory: Effort normal and breath sounds normal.  Neurological: She is alert and oriented to person,  place, and time.  Skin: Skin is warm and dry.  Psychiatric: She has a normal mood and affect.    Results for orders placed or performed during the hospital encounter of 02/25/16 (from the past 24 hour(s))  Pregnancy, urine     Status: None   Collection Time: 02/25/16  9:20 AM  Result Value Ref Range   Preg Test, Ur NEGATIVE NEGATIVE    No results found.  Assessment/Plan: 50 yo G3P3 MWF here for TLH/bilateral salpingectomy/cystoscopy, possible BSO.  All questions answered.  Pt ready to proceed.  Hale Bogus [name] 02/25/2016, 10:19 AM

## 2016-02-25 NOTE — Anesthesia Preprocedure Evaluation (Signed)
Anesthesia Evaluation  Patient identified by MRN, date of birth, ID band Patient awake    Reviewed: Allergy & Precautions, NPO status , Patient's Chart, lab work & pertinent test results  Airway Mallampati: II  TM Distance: >3 FB Neck ROM: Full    Dental no notable dental hx.    Pulmonary neg pulmonary ROS, Current Smoker,    Pulmonary exam normal breath sounds clear to auscultation       Cardiovascular negative cardio ROS Normal cardiovascular exam Rhythm:Regular Rate:Normal     Neuro/Psych negative neurological ROS  negative psych ROS   GI/Hepatic Neg liver ROS, GERD  ,  Endo/Other  negative endocrine ROS  Renal/GU negative Renal ROS  negative genitourinary   Musculoskeletal negative musculoskeletal ROS (+)   Abdominal   Peds negative pediatric ROS (+)  Hematology negative hematology ROS (+)   Anesthesia Other Findings   Reproductive/Obstetrics negative OB ROS                             Anesthesia Physical Anesthesia Plan  ASA: II  Anesthesia Plan: General   Post-op Pain Management:    Induction: Intravenous  Airway Management Planned: Oral ETT  Additional Equipment:   Intra-op Plan:   Post-operative Plan: Extubation in OR  Informed Consent: I have reviewed the patients History and Physical, chart, labs and discussed the procedure including the risks, benefits and alternatives for the proposed anesthesia with the patient or authorized representative who has indicated his/her understanding and acceptance.   Dental advisory given  Plan Discussed with: CRNA  Anesthesia Plan Comments:         Anesthesia Quick Evaluation

## 2016-02-25 NOTE — Anesthesia Procedure Notes (Signed)
Procedure Name: Intubation Date/Time: 02/25/2016 10:42 AM Performed by: Georgeanne Nim Pre-anesthesia Checklist: Patient identified, Timeout performed, Emergency Drugs available, Suction available and Patient being monitored Patient Re-evaluated:Patient Re-evaluated prior to inductionOxygen Delivery Method: Circle system utilized Preoxygenation: Pre-oxygenation with 100% oxygen Intubation Type: IV induction Ventilation: Mask ventilation without difficulty Laryngoscope Size: Mac and 3 Grade View: Grade I Tube type: Oral Tube size: 7.0 mm Number of attempts: 1 Airway Equipment and Method: Stylet Placement Confirmation: ETT inserted through vocal cords under direct vision,  positive ETCO2,  CO2 detector and breath sounds checked- equal and bilateral Secured at: 20 cm Tube secured with: Tape Dental Injury: Teeth and Oropharynx as per pre-operative assessment

## 2016-02-25 NOTE — Transfer of Care (Signed)
Immediate Anesthesia Transfer of Care Note  Patient: Robin Dawson  Procedure(s) Performed: Procedure(s) with comments: HYSTERECTOMY TOTAL LAPAROSCOPIC (N/A) - 1.5 hours BILATERAL SALPINGECTOMY (Bilateral) CYSTOSCOPY (N/A)  Patient Location: PACU  Anesthesia Type:General  Level of Consciousness: awake, alert , oriented and patient cooperative  Airway & Oxygen Therapy: Patient Spontanous Breathing and Patient connected to nasal cannula oxygen  Post-op Assessment: Report given to RN and Post -op Vital signs reviewed and stable  Post vital signs: Reviewed and stable  Last Vitals:  Filed Vitals:   02/25/16 0934  BP: 122/89  Pulse: 74  Temp: 36.7 C  Resp: 16    Last Pain:  Filed Vitals:   02/25/16 0946  PainSc: 3       Patients Stated Pain Goal: 3 ([id number])  Complications: No apparent anesthesia complications

## 2016-02-25 NOTE — Progress Notes (Signed)
Day of Surgery Procedure(s) (LRB): HYSTERECTOMY TOTAL LAPAROSCOPIC (N/A) BILATERAL SALPINGECTOMY (Bilateral) CYSTOSCOPY (N/A)  Subjective: Patient reports no nausea and good pain control.  Has voided x 1, 550cc.  Questions about surgery answered.  Objective: I have reviewed patient's vital signs, intake and output and medications. Filed Vitals:   02/25/16 1415 02/25/16 1430 02/25/16 1500 02/25/16 1615  BP:  134/91  124/78  Pulse:  77  69  Temp: 98.2 F (36.8 C) 97.7 F (36.5 C)  97.4 F (36.3 C)  TempSrc:  Oral  Axillary  Resp: 16 17  18   Height:   5\' 1"  (1.549 m)   Weight:   154 lb (69.854 kg)   SpO2:  97%  98%   UOP: 775cc  General: alert and cooperative Resp: clear to auscultation bilaterally Cardio: regular rate and rhythm, S1, S2 normal, no murmur, click, rub or gallop GI: soft, non-tender; bowel sounds normal; no masses,  no organomegaly and incision: clean, dry and intact Extremities: extremities normal, atraumatic, no cyanosis or edema Vaginal Bleeding: minimal  Assessment: s/p Procedure(s) with comments: HYSTERECTOMY TOTAL LAPAROSCOPIC (N/A) - 1.5 hours BILATERAL SALPINGECTOMY (Bilateral) CYSTOSCOPY (N/A): stable  Plan: Advance diet Encourage ambulation Advance to PO medication     Robin Dawson Robin Dawson 02/25/2016, 6:26 PM

## 2016-02-25 NOTE — Anesthesia Postprocedure Evaluation (Signed)
Anesthesia Post Note  Patient: Robin Dawson  Procedure(s) Performed: Procedure(s) (LRB): HYSTERECTOMY TOTAL LAPAROSCOPIC (N/A) BILATERAL SALPINGECTOMY (Bilateral) CYSTOSCOPY (N/A)  Patient location during evaluation: [facility] Anesthesia Type: General Level of consciousness: awake, awake and alert, oriented and patient cooperative Pain management: pain level controlled Vital Signs Assessment: post-procedure vital signs reviewed and stable Respiratory status: spontaneous breathing, nonlabored ventilation, respiratory function stable and patient connected to nasal cannula oxygen Cardiovascular status: stable Postop Assessment: no headache, no backache, patient able to bend at knees and no signs of nausea or vomiting Anesthetic complications: no     Last Vitals:  Filed Vitals:   02/25/16 1430 02/25/16 1615  BP: 134/91 124/78  Pulse: 77 69  Temp: 36.5 C 36.3 C  Resp: 17 18    Last Pain:  Filed Vitals:   02/25/16 1635  PainSc: 2    Pain Goal: Patients Stated Pain Goal: 2 (02/25/16 1430)               Michel Hendon L

## 2016-02-25 NOTE — Addendum Note (Signed)
Addendum  created 02/25/16 1835 by Raenette Rover, CRNA   Modules edited: Clinical Notes   Clinical Notes:  File: [id number]

## 2016-02-25 NOTE — Op Note (Signed)
02/25/2016  12:40 PM  PATIENT:  Robin Dawson  50 y.o. female  PRE-OPERATIVE DIAGNOSIS:  Symptomatic fibroid uterus, menorrhagia, pelvic pressure  POST-OPERATIVE DIAGNOSIS:  Same, endometriosis on right uterosacral ligament  PROCEDURE:  Procedure(s): HYSTERECTOMY TOTAL LAPAROSCOPIC BILATERAL SALPINGECTOMY CYSTOSCOPY  SURGEON:  Sheryn Aldaz [name]  ASSISTANTS: Dr. Sumner Boast   ANESTHESIA:   general  ESTIMATED BLOOD LOSS: 25cc  BLOOD ADMINISTERED:none   FLUIDS: 2200cc LR  UOP: 200cc clear UOP  SPECIMEN:  Uterus, cervix, bilateral tubes  DISPOSITION OF SPECIMEN:  PATHOLOGY  FINDINGS: enlarged, fibroid uterus, h/o BTL with two clips on each tube, endometriosis on right uterosacral ligament  DESCRIPTION OF OPERATION: Patient is taken to the operating room. She is placed in the supine position. She is a running IV in place. Informed consent was present on the chart. SCDs on her lower extremities and functioning properly. General endotracheal anesthesia was administered by the anesthesia staff without difficulty. Once adequate anesthesia was confirmed the legs are placed in the low lithotomy position in [place]. Her arms were tucked by the side.   Dura prep was then used to prep the abdomen and Betadine was used to prep the inner thighs, perineum and vagina. Once 3 minutes had past the patient was draped in a normal standard fashion. The legs were lifted to the high lithotomy position. The cervix was visualized by placing a heavy weighted speculum in the posterior aspect of the vagina and using a curved Deaver retractor to the retract anteriorly. The anterior lip of the cervix was grasped with single-tooth tenaculum.  The cervix sounded to 9.5 cm. Pratt dilators were used to dilate the cervix up to a #21. A RUMI uterine manipulator was obtained. A #8 disposable tip was placed on the RUMI manipulator as well as a lmedium, silver KOH ring. This was passed through the cervix and  the bulb of the disposable tip was inflated with 10 cc of normal saline. There was a good fit of the KOH ring around the cervix. The tenaculum was removed. There is also good manipulation of the uterus. The speculum and retractor were removed as well. A Foley catheter was placed to straight drain. The concentrated urine was noted. Legs were lowered to the low lithotomy position and attention was turned the abdomen.  The umbilicus was everted.  A Veress needle was obtained. Syringe of sterile saline was placed on a open Veress needle.  This was passed into the umbilicus until just when the fluid started to drip.  Then low flow CO2 gas was attached the needle and the pneumoperitoneum was achieved without difficulty. Once four liters of gas was in the abdomen the Veress needle was removed and a 5 millimeter non-bladed Optiview trocar and port were passed directly to the abdomen. The laparoscope was then used to confirm intraperitoneal placement. A large uterus with fundal fibroid was noted.  There was some endometriosis on the right uterosacral ligament.  Ovaries were normal.  Locations for RLQ and LLQ ports were noted by transillumination of the abdominal wall.  0.25% marcaine was used to anesthetize the skin.  76mm skin incisions were made and then 26mm bladed ports were placed.  Finally a midline incision was made about 4 cm above the pubic symphysis.  The skin was incised about 1cm and a non-bladed 37mm airseal port was placed with direct visualization of the laparoscope.    Ureters were identifies.  Attention was turned to the right side. With uterus on stretch the right tube  was excised off the ovary and mesosalpinx was dissected to free the tube. Then the right utero-ovarian pedicle was serially clamped cauterized and incised using the ligasure device. Right round ligament was serially clamped cauterized and incised. The anterior and posterior peritoneum of the inferior leaf of the broad ligament were opened.  The beginning of the baldde flap was created.  The bladder was taken down below the level of the KOH ring. The right uterine artery skeletonized and then just superior to the KOH ring this vessel was serially clamped, cauterized, and incised.  Attention was turned the right side.  The uterus was placed on stretch to the opposite side.  The tube was excised off the ovary using sharp dissection a bipolar cautery.  The mesosalpinx was incised freeing the tube. Then the left uterine ovarian pedicle was serially clamped cauterized and incised. Next the left round ligament was serially clamped cauterized and incised. The anterior posterior peritoneum of the inferiorly for the broad ligament were opened. The anterior peritoneum was carried across to the dissection on the left side. The remainder of the bladder flap was created using sharp dissection. The bladder was well below the level of the KOH ring. The left uterine artery skeletonized. Then the left uterine artery, above the level of the KOH ring, was serially clamped cauterized and incised. The uterus was devascularized at this point.  The colpotomy was performed a starting in the midline and using a harmonic scalpel with the inferior edge of the open blade  This was carried around a circumferential fashion until the vaginal mucosa was completely incised in the specimen was freed.  The specimen was then delivered to the vagina.  A vaginal occlusive device was used to maintain the pneumoperitoneum  Instruments were changed with a needle driver and million dollar graspers.  Using a 9 inch V. lock suture, the cuff was closed by incorporating the anterior and posterior vaginal mucosa in each stitch. This was carried across all the way to the left corner and a running fashion. Two stitches were brought back towards the midline and the suture was cut flush with the vagina. The needle was brought out the pelvis. The pelvis was irrigated. All pedicles were inspected.  No bleeding was noted.  Ureters were noted deep in the pelvis to be peristalsing.  At this point the procedure was completed.  RLQ and LLQ ports were removed.  Airseal was stopped relieving the pneumoperitoneum.  The largest port was closed at the fascial layer with a #0 Vicryl suture.  Suture was tied and excellent closure of the fascia was noted.  The remaining instruments were removed.  The midline port was removed.  The patient was taken out of Trendelenburg positioning.   The skin was then closed with subcuticular stitches of 3-0 Vicryl. The skin was cleansed Dermabond was applied. Attention was then turned the vagina and the cuff was inspected. No bleeding was noted. The anterior posterior vaginal mucosa was incorporated in each stitch. The Foley catheter was removed.  Cystoscopy was performed.  No sutures or bladder injuries were noted.  Ureters were noted with normal urine jets from each one was seen.  Cystoscopic fluid was drained.  Foley was left out.  Sponge, lap, needle, initially counts were correct x2. Patient tolerated the procedure very well. She was awakened from anesthesia, extubated and taken to recovery in stable condition.   COUNTS:  YES  PLAN OF CARE: Transfer to PACU

## 2016-02-26 ENCOUNTER — Encounter (HOSPITAL_COMMUNITY): Payer: Self-pay | Admitting: Obstetrics & Gynecology

## 2016-02-26 DIAGNOSIS — D251 Intramural leiomyoma of uterus: Secondary | ICD-10-CM | POA: Diagnosis not present

## 2016-02-26 LAB — BASIC METABOLIC PANEL
ANION GAP: 6 (ref 5–15)
BUN: 11 mg/dL (ref 6–20)
CALCIUM: 8.7 mg/dL — AB (ref 8.9–10.3)
CO2: 23 mmol/L (ref 22–32)
Chloride: 109 mmol/L (ref 101–111)
Creatinine, Ser: 0.7 mg/dL (ref 0.44–1.00)
GLUCOSE: 114 mg/dL — AB (ref 65–99)
Potassium: 3.8 mmol/L (ref 3.5–5.1)
Sodium: 138 mmol/L (ref 135–145)

## 2016-02-26 LAB — CBC
HCT: 35.4 % — ABNORMAL LOW (ref 36.0–46.0)
HEMOGLOBIN: 11.7 g/dL — AB (ref 12.0–15.0)
MCH: 30 pg (ref 26.0–34.0)
MCHC: 33.1 g/dL (ref 30.0–36.0)
MCV: 90.8 fL (ref 78.0–100.0)
PLATELETS: 341 10*3/uL (ref 150–400)
RBC: 3.9 MIL/uL (ref 3.87–5.11)
RDW: 13.6 % (ref 11.5–15.5)
WBC: 12.6 10*3/uL — ABNORMAL HIGH (ref 4.0–10.5)

## 2016-02-26 MED ORDER — IBUPROFEN 800 MG PO TABS: 800.0000 mg | ORAL_TABLET | Freq: Three times a day (TID) | ORAL | Status: DC | PRN

## 2016-02-26 MED ORDER — OXYCODONE-ACETAMINOPHEN 5-325 MG PO TABS: 1.0000 | ORAL_TABLET | ORAL | Status: DC | PRN

## 2016-02-26 NOTE — Discharge Instructions (Signed)

## 2016-02-26 NOTE — Progress Notes (Signed)
Pt ambulated out teaching complete  

## 2016-02-26 NOTE — Discharge Summary (Signed)
Physician Discharge Summary  Patient ID: Robin Dawson MRN: [id number] DOB/AGE: June 27, 1966 50 y.o.  Admit date: 02/25/2016 Discharge date: 02/26/2016  Admission Diagnoses: fibroids, menorrhagia  Discharge Diagnoses:  Active Problems:   * No active hospital problems. *   Discharged Condition: good  Hospital Course: Patient admitted through same day surgery.  She was taken to OR where TLH/bilateral salpingectomy/cystoscopy were performed.  Surgical findings included enlarged, fibroid uterus.  Surgery was uneventful.  EBL 25cc.  Foley catheter was removed before leaving OR.  Patient transferred to PACU where she was stable and then to 3rd floor for the remainder of her hospitalization.  During her post-op recovery, her vitals and stable and she was AF.  In evening of POD#0, she was able to transition to oral pain medications and regular diet.  She was able to ambulate and she had good pain control.  She was also able to void on her own.  Patient seen both in the evening of POD#0 and AM of POD#1.  In the AM of POD#1, she was without complaint.  Post op hb was 11.7, decreased from 13.2, pre-operatively.  At this point, patient was voiding, walking, having excellent pain control, had no nausea, and minimal vaginal bleeding.  She was ready for D/C.   Consults: None  Significant Diagnostic Studies: labs: 11.7  Treatments: surgery: TLH/bilateral salpingectomy/cystoscopy  Discharge Exam: Blood pressure 134/67, pulse 63, temperature 98.3 F (36.8 C), temperature source Oral, resp. rate 18, height 5\' 1"  (1.549 m), weight 154 lb (69.854 kg), SpO2 100 %. General appearance: alert and cooperative Resp: clear to auscultation bilaterally Cardio: regular rate and rhythm, S1, S2 normal, no murmur, click, rub or gallop GI: soft, non-tender; bowel sounds normal; no masses,  no organomegaly Extremities: extremities normal, atraumatic, no cyanosis or edema Incision/Wound:c/d/i  Disposition: 01-Home or  Self Care     Medication List    ASK your doctor about these medications        ADDERALL 30 MG tablet  Generic drug:  amphetamine-dextroamphetamine  Take 30 mg by mouth daily.     ATIVAN 1 MG tablet  Generic drug:  LORazepam  Take 1 mg by mouth every 8 (eight) hours as needed for anxiety.     BENADRYL 25 MG tablet  Generic drug:  diphenhydrAMINE  Take 50 mg by mouth every 6 (six) hours as needed (for ears draining).     CHANTIX STARTING MONTH PAK 0.5 MG X 11 & 1 MG X 42 tablet  Generic drug:  varenicline  Take 1 mg by mouth 2 (two) times daily.     ibuprofen 800 MG tablet  Commonly known as:  ADVIL,MOTRIN  Take 1 tablet (800 mg total) by mouth every 8 (eight) hours as needed.     LEXAPRO 20 MG tablet  Generic drug:  escitalopram  Take 20 mg by mouth at bedtime.           Follow-up Information    Follow up with Lyman Speller, MD On 03/06/2016.   Specialty:  Gynecology   Why:  10:00am   Contact information:   [place] [address] [phone]       Signed: Lyman Speller 02/26/2016, 12:03 PM

## 2016-02-26 NOTE — Progress Notes (Signed)
1 Day Post-Op Procedure(s) (LRB): HYSTERECTOMY TOTAL LAPAROSCOPIC (N/A) BILATERAL SALPINGECTOMY (Bilateral) CYSTOSCOPY (N/A)  Subjective: Patient reports good pain control.  No nausea.  She has passed gas.  Is emptying her bladder fine.  She is walking without assistance.  Objective: I have reviewed patient's vital signs, intake and output, medications and labs. Filed Vitals:   02/25/16 2200 02/26/16 0152 02/26/16 0600 02/26/16 1053  BP: 124/73 111/62 109/63 134/67  Pulse: 83 74 68 63  Temp: 98.1 F (36.7 C) 98.3 F (36.8 C) 98.2 F (36.8 C) 98.3 F (36.8 C)  TempSrc: Oral Oral Oral Oral  Resp: 16 16 17 18   Height:      Weight:      SpO2: 96% 97% 97% 100%   CBC    Component Value Date/Time   WBC 12.6* 02/26/2016 0559   RBC 3.90 02/26/2016 0559   HGB 11.7* 02/26/2016 0559   HCT 35.4* 02/26/2016 0559   PLT 341 02/26/2016 0559   MCV 90.8 02/26/2016 0559   MCH 30.0 02/26/2016 0559   MCHC 33.1 02/26/2016 0559   RDW 13.6 02/26/2016 0559      General: alert and cooperative Resp: clear to auscultation bilaterally Cardio: regular rate and rhythm, S1, S2 normal, no murmur, click, rub or gallop GI: soft, non-tender; bowel sounds normal; no masses,  no organomegaly and incision: clean, dry and intact Extremities: extremities normal, atraumatic, no cyanosis or edema Vaginal Bleeding: none  Assessment: s/p Procedure(s) with comments: HYSTERECTOMY TOTAL LAPAROSCOPIC (N/A) - 1.5 hours BILATERAL SALPINGECTOMY (Bilateral) CYSTOSCOPY (N/A): progressing well  Plan: Discharge home     [place] 02/26/2016, 12:00 PM

## 2016-03-04 ENCOUNTER — Ambulatory Visit: Payer: BC Managed Care – PPO | Admitting: Obstetrics & Gynecology

## 2016-03-06 ENCOUNTER — Encounter: Payer: Self-pay | Admitting: Obstetrics & Gynecology

## 2016-03-06 ENCOUNTER — Ambulatory Visit (INDEPENDENT_AMBULATORY_CARE_PROVIDER_SITE_OTHER): Payer: BC Managed Care – PPO | Admitting: Obstetrics & Gynecology

## 2016-03-06 VITALS — BP 140/88 | HR 88 | Resp 16 | Ht 61.0 in | Wt 154.2 lb

## 2016-03-06 DIAGNOSIS — Z9889 Other specified postprocedural states: Secondary | ICD-10-CM

## 2016-03-06 DIAGNOSIS — F17200 Nicotine dependence, unspecified, uncomplicated: Secondary | ICD-10-CM | POA: Insufficient documentation

## 2016-03-06 NOTE — Progress Notes (Signed)
Post Operative Visit  Procedure: HYSTERECTOMY TOTAL LAPAROSCOPIC, BILATERAL SALPINGECTOMY, CYSTOSCOPY     Days Post-op: 10 days  Subjective: Doing well.  Pathology reviewed.  Pt reports she is not having any bleeding.  She is not taking any narcotics.  Having regular bowel movements.  Voiding without difficulty.    Objective: Filed Vitals:   03/06/16 1009  BP: 140/88  Pulse: 88  Resp: 16  Height: 5\' 1"  (1.549 m)  Weight: 154 lb 3.2 oz (69.945 kg)   EXAM General: alert and cooperative Resp: clear to auscultation bilaterally Cardio: regular rate and rhythm, S1, S2 normal, no murmur, click, rub or gallop GI: soft, non-tender; bowel sounds normal; no masses,  no organomegaly and incision: clean, dry and intact Extremities: extremities normal, atraumatic, no cyanosis or edema Vaginal Bleeding: none     Assessment: s/p TLH, bilateral salpingectomy, cystoscopy Smoker  Plan: Recheck 3 weeks Activity that is appropriate for her at this stage in her recovery is discussed

## 2016-03-24 ENCOUNTER — Ambulatory Visit (INDEPENDENT_AMBULATORY_CARE_PROVIDER_SITE_OTHER): Payer: BC Managed Care – PPO | Admitting: Obstetrics & Gynecology

## 2016-03-24 ENCOUNTER — Encounter: Payer: Self-pay | Admitting: Obstetrics & Gynecology

## 2016-03-24 VITALS — BP 132/90 | HR 70 | Temp 98.1°F | Resp 18 | Ht 61.0 in | Wt 151.6 lb

## 2016-03-24 DIAGNOSIS — Z9889 Other specified postprocedural states: Secondary | ICD-10-CM

## 2016-03-24 MED ORDER — FLUCONAZOLE 150 MG PO TABS: ORAL_TABLET | ORAL | Status: DC

## 2016-03-24 NOTE — Progress Notes (Signed)
Post Operative Visit  Procedure: HYSTERECTOMY TOTAL LAPAROSCOPIC, BILATERAL SALPINGECTOMY,  CYSTOSCOPY     Days Post-op: 1 month, 2 days   Subjective: Pt reports she is doing well except for some vulvar itching.  Denies vaginal bleeding.  Bowel function is "back to normal" for her.  H/O IBS.  Emptying her bladder normally.  She continues to feel like she is able to hold urine for much longer than before she had the surgery.  Urinary incontinence has also resolved.    Objective: LMP 01/28/2016  Filed Vitals:   03/24/16 1424  BP: 132/90  Pulse: 70  Temp: 98.1 F (36.7 C)  Resp: 18  Height: 5\' 1"  (1.549 m)  Weight: 151 lb 9.6 oz (68.765 kg)   EXAM General: alert and cooperative Resp: clear to auscultation bilaterally Cardio: regular rate and rhythm, S1, S2 normal, no murmur, click, rub or gallop GI: soft, non-tender; bowel sounds normal; no masses,  no organomegaly and incision: clean, dry and intact Extremities: extremities normal, atraumatic, no cyanosis or edema Vaginal Bleeding: none  Gyn: erythema in perineum and around the rectum, whitish vaginal discharge, cuff healing well, cuff non tender, no masses/fullnes  Assessment: S/p TLH/bilateral salpingectomy/cystoscopy External yeast  Plan: Diflucan 150mg  po x 1, repeat 72 Pt has AEX for 1 year already scheduled Aware nothing in vaginal for 8 weeks

## 2016-03-28 ENCOUNTER — Ambulatory Visit: Payer: BC Managed Care – PPO | Admitting: Obstetrics & Gynecology

## 2016-09-04 ENCOUNTER — Encounter: Payer: Self-pay | Admitting: Obstetrics and Gynecology

## 2017-04-07 ENCOUNTER — Ambulatory Visit: Payer: BC Managed Care – PPO | Admitting: Obstetrics & Gynecology

## 2017-04-07 ENCOUNTER — Encounter: Payer: Self-pay | Admitting: Obstetrics & Gynecology

## 2017-04-07 NOTE — Progress Notes (Deleted)
51 y.o. [id number] SingleCaucasianF here for annual exam.    Patient's last menstrual period was 01/28/2016.          Sexually active: {yes no:314532}  The current method of family planning is tubal ligation.    Exercising: {yes no:314532}  {types:19826} Smoker:  {YES P5382123  Health Maintenance: Pap:  01/14/16 Neg. HR HPV:neg   01/05/14 Neg  History of abnormal Pap:  no MMG:  01/23/16 BIRADS1:neg  Colonoscopy:  *** BMD:   *** TDaP:  *** Pneumonia vaccine(s):  02/2016  Zostavax:   *** Hep C testing: *** Screening Labs: ***, Hb today: ***, Urine today: ***   reports that she has been smoking Cigarettes.  She has a 7.50 pack-year smoking history. She uses smokeless tobacco. She reports that she does not drink alcohol or use drugs.  Past Medical History:  Diagnosis Date  . ADD (attention deficit disorder)   . Anxiety   . Chronic left shoulder pain    DDD  . Chronic neck pain    DDD C3-4  . Chronic right shoulder pain    DDD  . DDD (degenerative disc disease), cervical    C3-4  . Depression   . Fibromyalgia    diag 12/13/14  . GERD (gastroesophageal reflux disease)    no meds, diet controlled  . Sleep apnea    does not use CPAP   . Smoker    3/4 PPD  . SVD (spontaneous vaginal delivery)    x 2    Past Surgical History:  Procedure Laterality Date  . BILATERAL SALPINGECTOMY Bilateral 02/25/2016   Procedure: BILATERAL SALPINGECTOMY;  Surgeon: Megan Salon, MD;  Location: [facility];  Service: Gynecology;  Laterality: Bilateral;  . CYSTOSCOPY N/A 02/25/2016   Procedure: CYSTOSCOPY;  Surgeon: Megan Salon, MD;  Location: [facility];  Service: Gynecology;  Laterality: N/A;  . LAPAROSCOPIC HYSTERECTOMY N/A 02/25/2016   Procedure: HYSTERECTOMY TOTAL LAPAROSCOPIC;  Surgeon: Megan Salon, MD;  Location: [facility];  Service: Gynecology;  Laterality: N/A;  1.5 hours  . TUBAL LIGATION Bilateral 1992   [facility]  . WISDOM TOOTH EXTRACTION      Current Outpatient Prescriptions  Medication  Sig Dispense Refill  . amphetamine-dextroamphetamine (ADDERALL) 30 MG tablet Take 30 mg by mouth daily.    . CHANTIX STARTING MONTH PAK 0.5 MG X 11 & 1 MG X 42 tablet Take 1 mg by mouth 2 (two) times daily.    . diphenhydrAMINE (BENADRYL) 25 MG tablet Take 50 mg by mouth every 6 (six) hours as needed (for ears draining).     Marland Kitchen escitalopram (LEXAPRO) 20 MG tablet Take 20 mg by mouth at bedtime.     . fluconazole (DIFLUCAN) 150 MG tablet 1 po x 1 tab, repeat 72 hours 2 tablet 0  . LORazepam (ATIVAN) 1 MG tablet Take 1 mg by mouth every 8 (eight) hours as needed for anxiety.      No current facility-administered medications for this visit.     Family History  Problem Relation Age of Onset  . Bipolar disorder Mother   . Breast cancer Maternal Grandmother   . Diabetes Paternal Grandmother   . Hypertension Paternal Grandfather   . Alzheimer's disease Father   . Bipolar disorder Sister     ROS:  Pertinent items are noted in HPI.  Otherwise, a comprehensive ROS was negative.  Exam:   LMP 01/28/2016   Weight change: @WEIGHTCHANGE @ Height:      Ht Readings from Last 3  Encounters:  03/24/16 5\' 1"  (1.549 m)  03/06/16 5\' 1"  (1.549 m)  02/25/16 5\' 1"  (1.549 m)    General appearance: alert, cooperative and appears stated age Head: Normocephalic, without obvious abnormality, atraumatic Neck: no adenopathy, supple, symmetrical, trachea midline and thyroid {EXAM; THYROID:18604} Lungs: clear to auscultation bilaterally Breasts: {Exam; breast:13139::"normal appearance, no masses or tenderness"} Heart: regular rate and rhythm Abdomen: soft, non-tender; bowel sounds normal; no masses,  no organomegaly Extremities: extremities normal, atraumatic, no cyanosis or edema Skin: Skin color, texture, turgor normal. No rashes or lesions Lymph nodes: Cervical, supraclavicular, and axillary nodes normal. No abnormal inguinal nodes palpated Neurologic: Grossly normal   Pelvic: External genitalia:  no  lesions              Urethra:  normal appearing urethra with no masses, tenderness or lesions              Bartholins and Skenes: normal                 Vagina: normal appearing vagina with normal color and discharge, no lesions              Cervix: {exam; cervix:14595}              Pap taken: {yes no:314532} Bimanual Exam:  Uterus:  {exam; uterus:12215}              Adnexa: {exam; adnexa:12223}               Rectovaginal: Confirms               Anus:  normal sphincter tone, no lesions  Chaperone was present for exam.  A:  Well Woman with normal exam  P:   {plan; gyn:5269::"mammogram","pap smear","return annually or prn"}

## 2018-03-02 ENCOUNTER — Ambulatory Visit: Payer: BC Managed Care – PPO | Admitting: Obstetrics & Gynecology

## 2018-03-02 ENCOUNTER — Encounter: Payer: Self-pay | Admitting: Obstetrics & Gynecology

## 2018-03-02 VITALS — BP 154/88 | HR 92 | Resp 18 | Ht 61.0 in | Wt 156.2 lb

## 2018-03-02 DIAGNOSIS — Z01419 Encounter for gynecological examination (general) (routine) without abnormal findings: Secondary | ICD-10-CM

## 2018-03-02 DIAGNOSIS — Z1211 Encounter for screening for malignant neoplasm of colon: Secondary | ICD-10-CM

## 2018-03-02 DIAGNOSIS — F900 Attention-deficit hyperactivity disorder, predominantly inattentive type: Secondary | ICD-10-CM

## 2018-03-02 NOTE — Progress Notes (Signed)
52 y.o. [id number] SingleCaucasianF here for annual exam.  Doing well.  Working at [employer] for the summer.  Having some ear issues--thinks the tube in her right ear has come out.  Has appt with ENT.  Has two years left before retirement.  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  She feels her current work environment is much better, however.    Denies vaginal bleeding.    PCP:  Dr. Hilma Favors.  Having blood work done next month.  He thinks her adderall may be increasing her BP.  He is going to adjust this next month.    Patient's last menstrual period was 01/28/2016.          Sexually active: Yes.    The current method of family planning is status post hysterectomy.    Exercising: Yes.    walking Smoker:  yes  Health Maintenance: Pap:  01/14/16 neg. HR HPV:Neg   01/05/14 Neg  History of abnormal Pap:  no MMG:  01/23/16 BIRADS1:Neg. 02/17/18 Normal.  Pt will sign release of records for this today. Colonoscopy:  N/a.  D/w new guidelines with pt.   BMD:   n/a TDaP:  ~2017 Pneumonia vaccine(s):  2017 Shingrix:   n/a Hep C testing: n/a Screening Labs: Has scheduled for two weeks from now.   reports that she has been smoking cigarettes.  She has a 15.00 pack-year smoking history. She uses smokeless tobacco. She reports that she does not drink alcohol or use drugs.  Past Medical History:  Diagnosis Date  . ADD (attention deficit disorder)   . Anxiety   . Chronic left shoulder pain    DDD  . Chronic neck pain    DDD C3-4  . Chronic right shoulder pain    DDD  . DDD (degenerative disc disease), cervical    C3-4  . Depression   . Fibromyalgia    diag 12/13/14  . GERD (gastroesophageal reflux disease)    no meds, diet controlled  . Sleep apnea    does not use CPAP   . Smoker    3/4 PPD  . SVD (spontaneous vaginal delivery)    x 2    Past Surgical History:  Procedure Laterality Date  . BILATERAL SALPINGECTOMY Bilateral 02/25/2016    Procedure: BILATERAL SALPINGECTOMY;  Surgeon: Megan Salon, MD;  Location: [facility];  Service: Gynecology;  Laterality: Bilateral;  . CYSTOSCOPY N/A 02/25/2016   Procedure: CYSTOSCOPY;  Surgeon: Megan Salon, MD;  Location: [facility];  Service: Gynecology;  Laterality: N/A;  . LAPAROSCOPIC HYSTERECTOMY N/A 02/25/2016   Procedure: HYSTERECTOMY TOTAL LAPAROSCOPIC;  Surgeon: Megan Salon, MD;  Location: [facility];  Service: Gynecology;  Laterality: N/A;  1.5 hours  . TUBAL LIGATION Bilateral 1992   [facility]  . WISDOM TOOTH EXTRACTION      Current Outpatient Medications  Medication Sig Dispense Refill  . amphetamine-dextroamphetamine (ADDERALL) 30 MG tablet Take 30 mg by mouth daily.    . diphenhydrAMINE (BENADRYL) 25 MG tablet Take 50 mg by mouth every 6 (six) hours as needed (for ears draining).     Marland Kitchen escitalopram (LEXAPRO) 20 MG tablet Take 20 mg by mouth at bedtime.     Marland Kitchen LORazepam (ATIVAN) 1 MG tablet Take 1 mg by mouth every 8 (eight) hours as needed for anxiety.     Marland Kitchen rOPINIRole (REQUIP) 0.5 MG tablet Take 1 tablet by mouth daily.    . traMADol-acetaminophen (  ULTRACET) 37.5-325 MG tablet Take 1 tablet by mouth daily as needed.     No current facility-administered medications for this visit.     Family History  Problem Relation Age of Onset  . Alzheimer's disease Father   . Bipolar disorder Mother   . Breast cancer Maternal Grandmother   . Diabetes Paternal Grandmother   . Hypertension Paternal Grandfather   . Bipolar disorder Sister     Review of Systems  Gastrointestinal: Positive for constipation and diarrhea.       Bloating   Genitourinary: Positive for urgency.       Loss of urine with sneeze or cough  Night urination   All other systems reviewed and are negative.   Exam:   BP (!) 154/88 (BP Location: Right Arm, Patient Position: Sitting, Cuff Size: Normal)   Pulse 92   Resp 18   Ht 5\' 1"  (1.549 m)   Wt 156 lb 3.2 oz (70.9 kg)   LMP 01/28/2016   BMI 29.51 kg/m      Height: 5\' 1"  (154.9 cm)  Ht Readings from Last 3 Encounters:  03/02/18 5\' 1"  (1.549 m)  03/24/16 5\' 1"  (1.549 m)  03/06/16 5\' 1"  (1.549 m)    General appearance: alert, cooperative and appears stated age Head: Normocephalic, without obvious abnormality, atraumatic Neck: no adenopathy, supple, symmetrical, trachea midline and thyroid normal to inspection and palpation Lungs: clear to auscultation bilaterally Breasts: normal appearance, no masses or tenderness Heart: regular rate and rhythm Abdomen: soft, non-tender; bowel sounds normal; no masses,  no organomegaly Extremities: extremities normal, atraumatic, no cyanosis or edema Skin: Skin color, texture, turgor normal. No rashes or lesions Lymph nodes: Cervical, supraclavicular, and axillary nodes normal. No abnormal inguinal nodes palpated Neurologic: Grossly normal   Pelvic: External genitalia:  no lesions              Urethra:  normal appearing urethra with no masses, tenderness or lesions              Bartholins and Skenes: normal                 Vagina: normal appearing vagina with normal color and discharge, no lesions              Cervix: absent              Pap taken: No. Bimanual Exam:  Uterus:  uterus absent              Adnexa: no mass, fullness, tenderness               Rectovaginal: Confirms               Anus:  normal sphincter tone, no lesions  Chaperone was present for exam.  A:  Well Woman with normal exam H/O anxiety, depression, ADD H/O TLH/bilateral salpingectomy/cystoscopy DDD in neck H/O fibromyalgia Elevated BP--being followed by Dr. Hilma Favors  P:   Mammogram guidelines reviewed.  Release will be signed for last MMG from 6/19 pap smear not indicated Lab work will be done with Dr. Hilma Favors in two weeks Referral for colonoscopy return annually or prn

## 2018-03-16 ENCOUNTER — Telehealth: Payer: Self-pay | Admitting: Obstetrics & Gynecology

## 2018-03-16 NOTE — Telephone Encounter (Signed)
Left voicemail regarding referral appointment. The information is listed below. Should the patient need to cancel or reschedule this appointment, Please advise them to call the office they've been referred to in order to reschedule.  [facility] [address] [place], [place]  Phone: [phone]  Dr. Collene Mares 04/20/18 @ 8:30 am. Please arrive 15 minutes early and bring your insurance card and photo id and list of medications

## 2018-03-17 NOTE — Telephone Encounter (Signed)
Left voicemail regarding referral appointment. The information is listed below. Should the patient need to cancel or reschedule this appointment, Please advise them to call the office they've been referred to in order to reschedule.  [facility] [address] [place], [place]  Phone: [phone]  Dr. Collene Mares 04/20/18 @ 8:30 am. Please arrive 15 minutes early and bring your insurance card and photo id and list of medications.

## 2018-11-25 ENCOUNTER — Encounter: Payer: Self-pay | Admitting: Internal Medicine

## 2019-01-17 ENCOUNTER — Encounter

## 2019-02-08 NOTE — Progress Notes (Addendum)
Primary Care Physician:  Sharilyn Sites, MD  Primary Gastroenterologist:    Chief Complaint  Patient presents with  . Colonoscopy    HPI:  Robin Dawson is a 53 y.o. female here at the request of Dr. Hilma Favors for screening colonoscopy.  This will be the patient's first colonoscopy.  H/o IBS, most of her life she has had alternating constipation and diarrhea, predominantly diarrhea.  Postprandial urgency.  Symptoms fairly mild, does not interfere with her job or activities.  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  She denies any melena or rectal bleeding.  No abdominal pain.  No upper GI symptoms.  No family history of colon cancer.  Current Outpatient Medications  Medication Sig Dispense Refill  . albuterol (ACCUNEB) 1.25 MG/3ML nebulizer solution Take 1 ampule by nebulization every 6 (six) hours as needed for wheezing.    Marland Kitchen amphetamine-dextroamphetamine (ADDERALL) 30 MG tablet Take 30 mg by mouth daily.    Marland Kitchen escitalopram (LEXAPRO) 20 MG tablet Take 20 mg by mouth at bedtime.     Marland Kitchen levocetirizine (XYZAL) 2.5 MG/5ML solution Take 2.5 mg by mouth every evening.    Marland Kitchen LORazepam (ATIVAN) 1 MG tablet Take 1 mg by mouth every 8 (eight) hours as needed for anxiety.     Marland Kitchen rOPINIRole (REQUIP) 0.5 MG tablet Take 1 tablet by mouth daily.     No current facility-administered medications for this visit.     Allergies as of 02/09/2019 - Review Complete 02/09/2019  Allergen Reaction Noted  . Lyrica [pregabalin]  01/12/2015  . Codeine Itching, Nausea And Vomiting, and Rash 01/01/2012    Past Medical History:  Diagnosis Date  . ADD (attention deficit disorder)   . Anxiety   . Chronic shoulder pain    right and left  . DDD (degenerative disc disease), cervical    C3-4  . Depression   . Fibromyalgia    diag 12/13/14  . GERD (gastroesophageal reflux disease)    no meds, diet controlled  . Sleep apnea    does not use CPAP   . Smoker    3/4 PPD    Past Surgical History:  Procedure Laterality Date  . BILATERAL  SALPINGECTOMY Bilateral 02/25/2016   Procedure: BILATERAL SALPINGECTOMY;  Surgeon: Megan Salon, MD;  Location: [facility];  Service: Gynecology;  Laterality: Bilateral;  . CYSTOSCOPY N/A 02/25/2016   Procedure: CYSTOSCOPY;  Surgeon: Megan Salon, MD;  Location: [facility];  Service: Gynecology;  Laterality: N/A;  . LAPAROSCOPIC HYSTERECTOMY N/A 02/25/2016   Procedure: HYSTERECTOMY TOTAL LAPAROSCOPIC;  Surgeon: Megan Salon, MD;  Location: [facility];  Service: Gynecology;  Laterality: N/A;  1.5 hours  . TUBAL LIGATION Bilateral 1992   [facility]  . WISDOM TOOTH EXTRACTION      Family History  Problem Relation Age of Onset  . Alzheimer's disease Father   . Bipolar disorder Mother   . Breast cancer Maternal Grandmother   . Diabetes Paternal Grandmother   . Hypertension Paternal Grandfather   . Bipolar disorder Sister   . Colon cancer Neg Hx     Social History   Socioeconomic History  . Marital status: Single    Spouse name: Not on file  . Number of children: Not on file  . Years of education: Not on file  . Highest education level: Not on file  Occupational History  . Not on file  Social Needs  . Financial resource strain: Not on file  . Food insecurity:    Worry: Not on  file    Inability: Not on file  . Transportation needs:    Medical: Not on file    Non-medical: Not on file  Tobacco Use  . Smoking status: Current Every Day Smoker    Packs/day: 0.50    Years: 30.00    Pack years: 15.00    Types: Cigarettes  . Smokeless tobacco: Current User  Substance and Sexual Activity  . Alcohol use: No    Alcohol/week: 2.0 standard drinks    Types: 2 Standard drinks or equivalent per week    Comment: rarely  . Drug use: No    Comment: Uses Vapor twice weekly  . Sexual activity: Yes    Partners: Male    Birth control/protection: Surgical    Comment: BTL  Lifestyle  . Physical activity:    Days per week: Not on file    Minutes per session: Not on file  . Stress: Not on file   Relationships  . Social connections:    Talks on phone: Not on file    Gets together: Not on file    Attends religious service: Not on file    Active member of club or organization: Not on file    Attends meetings of clubs or organizations: Not on file    Relationship status: Not on file  . Intimate partner violence:    Fear of current or ex partner: Not on file    Emotionally abused: Not on file    Physically abused: Not on file    Forced sexual activity: Not on file  Other Topics Concern  . Not on file  Social History Narrative  . Not on file      ROS:  General: Negative for anorexia, weight loss, fever, chills, fatigue, weakness. Eyes: Negative for vision changes.  ENT: Negative for hoarseness, difficulty swallowing , nasal congestion. CV: Negative for chest pain, angina, palpitations, dyspnea on exertion, peripheral edema.  Respiratory: Negative for dyspnea at rest, dyspnea on exertion, cough, sputum, wheezing.  GI: See history of present illness. GU:  Negative for dysuria, hematuria, urinary incontinence, urinary frequency, nocturnal urination.  MS: Negative for joint pain, low back pain.  Derm: Negative for rash or itching.  Neuro: Negative for weakness, abnormal sensation, seizure, frequent headaches, memory loss, confusion.  Psych: Negative for anxiety, depression, suicidal ideation, hallucinations.  Endo: Negative for unusual weight change.  Heme: Negative for bruising or bleeding. Allergy: Negative for rash or hives.    Physical Examination:  BP (!) 170/88   Pulse 90   Temp (!) 97.2 F (36.2 C)   Ht 5' (1.524 m)   Wt 165 lb 9.6 oz (75.1 kg)   LMP 01/28/2016   BMI 32.34 kg/m    General: Well-nourished, well-developed in no acute distress.  Head: Normocephalic, atraumatic.   Eyes: Conjunctiva pink, no icterus. Mouth: Oropharyngeal mucosa moist and pink , no lesions erythema or exudate. Neck: Supple without thyromegaly, masses, or lymphadenopathy.   Lungs: Clear to auscultation bilaterally.  Heart: Regular rate and rhythm, no murmurs rubs or gallops.  Abdomen: Bowel sounds are normal, nontender, nondistended, no hepatosplenomegaly or masses, no abdominal bruits or    hernia , no rebound or guarding.   Rectal: Not performed Extremities: No lower extremity edema. No clubbing or deformities.  Neuro: Alert and oriented x 4 , grossly normal neurologically.  Skin: Warm and dry, no rash or jaundice.   Psych: Alert and cooperative, normal mood and affect.

## 2019-02-09 ENCOUNTER — Encounter: Payer: Self-pay | Admitting: Gastroenterology

## 2019-02-09 ENCOUNTER — Ambulatory Visit (INDEPENDENT_AMBULATORY_CARE_PROVIDER_SITE_OTHER): Payer: BC Managed Care – PPO | Admitting: Gastroenterology

## 2019-02-09 ENCOUNTER — Telehealth: Payer: Self-pay | Admitting: *Deleted

## 2019-02-09 ENCOUNTER — Other Ambulatory Visit: Payer: Self-pay

## 2019-02-09 DIAGNOSIS — Z79899 Other long term (current) drug therapy: Secondary | ICD-10-CM | POA: Insufficient documentation

## 2019-02-09 DIAGNOSIS — Z1211 Encounter for screening for malignant neoplasm of colon: Secondary | ICD-10-CM

## 2019-02-09 NOTE — Assessment & Plan Note (Signed)
Pleasant 53 year old female presenting to schedule first ever screening colonoscopy.  Due to polypharmacy, plan for deep sedation.  I have discussed the risks, alternatives, benefits with regards to but not limited to the risk of reaction to medication, bleeding, infection, perforation and the patient is agreeable to proceed. Written consent to be obtained.

## 2019-02-09 NOTE — Patient Instructions (Addendum)
1. Colonoscopy as scheduled.  Please see separate instructions. 2. Your blood pressure was elevated today.  You have a way of checking her blood pressure at home, please do so.  You can also check with local pharmacy such as [facility].  Please follow-up with your PCP if your blood pressure remains elevated, greater than 140/90.

## 2019-02-09 NOTE — Telephone Encounter (Signed)
LMOVM to call back to schedule TCS w/ prop w/ RMR

## 2019-02-09 NOTE — Progress Notes (Signed)
cc'ed to pcp °

## 2019-02-10 NOTE — Telephone Encounter (Signed)
LMOVM

## 2019-02-10 NOTE — Telephone Encounter (Signed)
Letter mailed to call back

## 2019-06-07 ENCOUNTER — Other Ambulatory Visit: Payer: Self-pay

## 2019-06-07 DIAGNOSIS — Z20822 Contact with and (suspected) exposure to covid-19: Secondary | ICD-10-CM

## 2019-06-08 LAB — NOVEL CORONAVIRUS, NAA: SARS-CoV-2, NAA: NOT DETECTED

## 2019-06-17 ENCOUNTER — Ambulatory Visit: Payer: BC Managed Care – PPO | Admitting: Obstetrics & Gynecology

## 2019-06-17 ENCOUNTER — Encounter

## 2019-09-05 ENCOUNTER — Ambulatory Visit (INDEPENDENT_AMBULATORY_CARE_PROVIDER_SITE_OTHER): Payer: BC Managed Care – PPO | Admitting: Obstetrics & Gynecology

## 2019-09-05 ENCOUNTER — Other Ambulatory Visit: Payer: Self-pay

## 2019-09-05 ENCOUNTER — Encounter: Payer: Self-pay | Admitting: Obstetrics & Gynecology

## 2019-09-05 VITALS — BP 160/80 | HR 85 | Temp 97.9°F | Ht 61.0 in | Wt 158.4 lb

## 2019-09-05 DIAGNOSIS — Z01419 Encounter for gynecological examination (general) (routine) without abnormal findings: Secondary | ICD-10-CM | POA: Diagnosis not present

## 2019-09-05 MED ORDER — AMOXICILLIN-POT CLAVULANATE 875-125 MG PO TABS: 1.0000 | ORAL_TABLET | Freq: Two times a day (BID) | ORAL | 0 refills | Status: DC

## 2019-09-05 MED ORDER — ESTRADIOL 0.5 MG PO TABS: 0.5000 mg | ORAL_TABLET | Freq: Every day | ORAL | 1 refills | Status: DC

## 2019-09-05 NOTE — Progress Notes (Signed)
53 y.o. [id number] Single White or Caucasian female here for annual exam.  Doing well.  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    Denies vaginal bleeding.  Has lots of hot flashes.  Really struggling with this.  Is on lexapro and this doesn't seem to help.  Also having some emotional lability.    Having ear pain and drainage.  H/o otitis media.  Does not want to have to go to another doctor's office due to Covid 19.  Has blood work planned with Dr. Hilma Favors in two months.    Patient's last menstrual period was 01/28/2016.          Sexually active: Yes.    The current method of family planning is status post hysterectomy.    Exercising: Yes.    Exercising at home. Smoker:  yes  Health Maintenance: Pap:  01/14/16 neg. HR HPV:Neg              01/05/14 Neg  History of abnormal Pap:  no MMG:  01/23/16 BIRADS1:Neg. 02/17/18 BIRADS 1, Negative Colonoscopy:  had colonoscopy scheduled and cancelled this.  Has a cologuard at home but just hasn't done it.   BMD:   n/a TDaP:  ~2017 Pneumonia vaccine(s):  2017 Shingrix:   n/a Hep C testing: n/a Screening Labs: PCP   reports that she has been smoking cigarettes. She has a 15.00 pack-year smoking history. She uses smokeless tobacco. She reports that she does not drink alcohol or use drugs.  Past Medical History:  Diagnosis Date  . ADD (attention deficit disorder)   . Anxiety   . Chronic shoulder pain    right and left  . DDD (degenerative disc disease), cervical    C3-4  . Depression   . Fibromyalgia    diag 12/13/14  . GERD (gastroesophageal reflux disease)    no meds, diet controlled  . Sleep apnea    does not use CPAP   . Smoker    3/4 PPD    Past Surgical History:  Procedure Laterality Date  . BILATERAL SALPINGECTOMY Bilateral 02/25/2016   Procedure: BILATERAL SALPINGECTOMY;  Surgeon: Megan Salon, MD;  Location: [facility];  Service: Gynecology;  Laterality: Bilateral;  . CYSTOSCOPY N/A 02/25/2016   Procedure:  CYSTOSCOPY;  Surgeon: Megan Salon, MD;  Location: [facility];  Service: Gynecology;  Laterality: N/A;  . LAPAROSCOPIC HYSTERECTOMY N/A 02/25/2016   Procedure: HYSTERECTOMY TOTAL LAPAROSCOPIC;  Surgeon: Megan Salon, MD;  Location: [facility];  Service: Gynecology;  Laterality: N/A;  1.5 hours  . TUBAL LIGATION Bilateral 1992   [facility]  . WISDOM TOOTH EXTRACTION      Current Outpatient Medications  Medication Sig Dispense Refill  . albuterol (ACCUNEB) 1.25 MG/3ML nebulizer solution Take 1 ampule by nebulization every 6 (six) hours as needed for wheezing.    Marland Kitchen amphetamine-dextroamphetamine (ADDERALL) 30 MG tablet Take 30 mg by mouth daily.    Marland Kitchen escitalopram (LEXAPRO) 20 MG tablet Take 20 mg by mouth at bedtime.     Marland Kitchen levocetirizine (XYZAL) 2.5 MG/5ML solution Take 2.5 mg by mouth every evening.    Marland Kitchen LORazepam (ATIVAN) 1 MG tablet Take 1 mg by mouth every 8 (eight) hours as needed for anxiety.     Marland Kitchen rOPINIRole (REQUIP) 0.5 MG tablet Take 1 tablet by mouth daily.     No current facility-administered medications for this visit.    Family History  Problem Relation Age of Onset  .  Alzheimer's disease Father   . Bipolar disorder Mother   . Breast cancer Maternal Grandmother   . Diabetes Paternal Grandmother   . Hypertension Paternal Grandfather   . Bipolar disorder Sister   . Colon cancer Neg Hx     Review of Systems  HENT: Positive for ear pain.   Genitourinary:       Incontinence-stress    Exam:   Vitals:   09/05/19 1402  BP: (!) 160/90  Pulse: 85  Temp: 97.9 F (36.6 C)  repeat BP 160/80  General appearance: alert, cooperative and appears stated age Head: Normocephalic, without obvious abnormality, atraumatic Neck: no adenopathy, supple, symmetrical, trachea midline and thyroid normal to inspection and palpation Lungs: clear to auscultation bilaterally Breasts: normal appearance, no masses or tenderness Heart: regular rate and rhythm Abdomen: soft, non-tender; bowel sounds  normal; no masses,  no organomegaly Extremities: extremities normal, atraumatic, no cyanosis or edema Skin: Skin color, texture, turgor normal. No rashes or lesions Lymph nodes: Cervical, supraclavicular, and axillary nodes normal. No abnormal inguinal nodes palpated Neurologic: Grossly normal   Pelvic: External genitalia:  no lesions              Urethra:  normal appearing urethra with no masses, tenderness or lesions              Bartholins and Skenes: normal                 Vagina: normal appearing vagina with normal color and discharge, no lesions              Cervix: absent              Pap taken: No. Bimanual Exam:  Uterus:  uterus absent              Adnexa: normal adnexa               Rectovaginal: Confirms               Anus:  normal sphincter tone, no lesions  Chaperone, Marisa Sprinkles, CMA, was present for exam.  A:  Well Woman with normal exam PMP, no HRT Severe flashes Allergies Otits media H/o TLH/Bilateral salpingectomy/cystoscopy H/o fibromyalgia Elevated BP today  P:   Mammogram guidelines reviewed.  States she had one in June, 2020.  Will call to get copy of this pap smear not indicated Colon cancer screening discussed.  She has cologuard at home she will complete Augmentin 875mg  bid x 7 day.  #14/0RF. Is aware to share with Dr. Hilma Favors BP's from today.  She feels this is related to Gyn visit today so will start monitoring from home.  Has appt scheduled and blood work scheduled Will try estradiol 0.5mg  tablets daily.  #30/1RF.  She is going to give update in 1 month. Return annually or prn

## 2019-09-23 ENCOUNTER — Telehealth: Payer: Self-pay | Admitting: Obstetrics & Gynecology

## 2019-09-23 ENCOUNTER — Other Ambulatory Visit: Payer: Self-pay | Admitting: Obstetrics & Gynecology

## 2019-09-23 ENCOUNTER — Encounter: Payer: Self-pay | Admitting: Obstetrics & Gynecology

## 2019-09-23 NOTE — Telephone Encounter (Signed)
Robin Dawson, Robin "ANDI"  P [facility] Clinical Pool  Phone Number: [phone]  Can you refill script for antibiotic? I am still experiencing drainage and minor pain. Thank you.

## 2019-09-23 NOTE — Telephone Encounter (Signed)
Left message for pt to call back to triage RN.  

## 2019-11-04 ENCOUNTER — Other Ambulatory Visit: Payer: Self-pay | Admitting: Obstetrics & Gynecology

## 2019-11-05 ENCOUNTER — Other Ambulatory Visit: Payer: Self-pay | Admitting: Obstetrics & Gynecology

## 2019-11-05 ENCOUNTER — Encounter: Payer: Self-pay | Admitting: Obstetrics & Gynecology

## 2019-11-05 MED ORDER — ESTRADIOL 0.5 MG PO TABS: 0.5000 mg | ORAL_TABLET | Freq: Every day | ORAL | 3 refills | Status: DC

## 2019-11-13 ENCOUNTER — Ambulatory Visit: Payer: BC Managed Care – PPO | Attending: Internal Medicine

## 2019-11-13 DIAGNOSIS — Z23 Encounter for immunization: Secondary | ICD-10-CM | POA: Insufficient documentation

## 2019-11-13 NOTE — Progress Notes (Signed)
   Covid-19 Vaccination Clinic  Name:  Robin Dawson    MRN: [id number] DOB: [birth year]  11/13/2019  [name] was observed post Covid-19 immunization for 15 minutes without incident. She was provided with Vaccine Information Sheet and instruction to access the V-Safe system.   [name] was instructed to call 911 with any severe reactions post vaccine: Marland Kitchen Difficulty breathing  . Swelling of face and throat  . A fast heartbeat  . A bad rash all over body  . Dizziness and weakness   Immunizations Administered    Name Date Dose VIS Date Route   Pfizer COVID-19 Vaccine 11/13/2019  8:45 AM 0.3 mL 08/19/2019 Intramuscular   Manufacturer: [place]   Lot: [id number]   [place]: [id number]

## 2019-12-01 ENCOUNTER — Ambulatory Visit: Payer: BC Managed Care – PPO | Admitting: Obstetrics & Gynecology

## 2019-12-04 ENCOUNTER — Ambulatory Visit: Payer: Self-pay | Attending: Internal Medicine

## 2019-12-04 DIAGNOSIS — Z23 Encounter for immunization: Secondary | ICD-10-CM

## 2019-12-04 NOTE — Progress Notes (Signed)
   Covid-19 Vaccination Clinic  Name:  Robin Dawson    MRN: [id number] DOB: July 07, 1966  12/04/2019  Ms. Frame was observed post Covid-19 immunization for 15 minutes without incident. She was provided with Vaccine Information Sheet and instruction to access the V-Safe system.   [name] was instructed to call 911 with any severe reactions post vaccine: Marland Kitchen Difficulty breathing  . Swelling of face and throat  . A fast heartbeat  . A bad rash all over body  . Dizziness and weakness   Immunizations Administered    Name Date Dose VIS Date Route   Pfizer COVID-19 Vaccine 12/04/2019 10:08 AM 0.3 mL 08/19/2019 Intramuscular   Manufacturer: [place]   Lot: [id number]   [place]: [id number]

## 2020-05-18 ENCOUNTER — Other Ambulatory Visit: Payer: BC Managed Care – PPO

## 2020-05-18 ENCOUNTER — Other Ambulatory Visit: Payer: Self-pay

## 2020-05-18 DIAGNOSIS — Z20822 Contact with and (suspected) exposure to covid-19: Secondary | ICD-10-CM

## 2020-05-22 LAB — NOVEL CORONAVIRUS, NAA: SARS-CoV-2, NAA: NOT DETECTED

## 2020-10-01 ENCOUNTER — Other Ambulatory Visit: Payer: BC Managed Care – PPO

## 2020-10-29 ENCOUNTER — Other Ambulatory Visit: Payer: Self-pay | Admitting: Obstetrics & Gynecology

## 2020-10-31 ENCOUNTER — Other Ambulatory Visit: Payer: Self-pay | Admitting: Obstetrics & Gynecology

## 2020-11-07 ENCOUNTER — Other Ambulatory Visit: Payer: Self-pay | Admitting: Obstetrics & Gynecology

## 2020-11-20 ENCOUNTER — Ambulatory Visit: Payer: BC Managed Care – PPO

## 2021-02-04 ENCOUNTER — Other Ambulatory Visit: Payer: Self-pay | Admitting: Obstetrics & Gynecology

## 2021-02-05 NOTE — Telephone Encounter (Signed)
Please call pt.  Rx for OCPs sent to pharmacy.  She is due appt in June and cancelled at prior office.  She lives in [place] and my new location is definitely a longer drive so she may seek care closer to home.  If not, can you make her an appt?  Thanks.

## 2021-02-05 NOTE — Telephone Encounter (Signed)
Please disregard the prior note on this refill request.  Pt does not live in [place] but her last AEX was 08/2019 and she is overdue for appt.  Not sure if she is planning on coming to new location or has found new provider.  Does not have appt with prior office.  Please call pt to see her plans.  I have not done the refill yet.  Thanks.

## 2021-02-05 NOTE — Telephone Encounter (Signed)
Attempt made to contact Robin Dawson is a 55 y.o. female re: message from Dr. Sabra Heck below.  Pt was not available.  LM on the VM for the patient to call me back.

## 2021-02-12 NOTE — Telephone Encounter (Signed)
LMOVM for pt to return my call.  

## 2021-02-27 ENCOUNTER — Telehealth (HOSPITAL_BASED_OUTPATIENT_CLINIC_OR_DEPARTMENT_OTHER): Payer: Self-pay

## 2021-02-27 NOTE — Telephone Encounter (Signed)
Tried calling patient again about denied medication. Also wanted to know if she had found her another physician or does she plan to come back to see Dr. Sabra Heck. tbw

## 2021-03-13 ENCOUNTER — Encounter (HOSPITAL_BASED_OUTPATIENT_CLINIC_OR_DEPARTMENT_OTHER): Payer: Self-pay | Admitting: Obstetrics & Gynecology

## 2021-05-23 ENCOUNTER — Other Ambulatory Visit: Payer: Self-pay

## 2021-05-23 ENCOUNTER — Emergency Department (HOSPITAL_COMMUNITY): Payer: BC Managed Care – PPO

## 2021-05-23 ENCOUNTER — Encounter (HOSPITAL_COMMUNITY): Payer: Self-pay

## 2021-05-23 ENCOUNTER — Emergency Department (HOSPITAL_COMMUNITY)
Admission: EM | Admit: 2021-05-23 | Discharge: 2021-05-23 | Disposition: A | Discharge status: Home / Self Care | Payer: BC Managed Care – PPO | Attending: Emergency Medicine | Admitting: Emergency Medicine

## 2021-05-23 DIAGNOSIS — R0789 Other chest pain: Secondary | ICD-10-CM | POA: Diagnosis not present

## 2021-05-23 DIAGNOSIS — F17210 Nicotine dependence, cigarettes, uncomplicated: Secondary | ICD-10-CM | POA: Insufficient documentation

## 2021-05-23 LAB — HEPATIC FUNCTION PANEL
ALT: 19 U/L (ref 0–44)
AST: 14 U/L — ABNORMAL LOW (ref 15–41)
Albumin: 3.3 g/dL — ABNORMAL LOW (ref 3.5–5.0)
Alkaline Phosphatase: 83 U/L (ref 38–126)
Bilirubin, Direct: <0.1 mg/dL (ref 0.0–0.2)
Total Bilirubin: 0.3 mg/dL (ref 0.3–1.2)
Total Protein: 6.4 g/dL — ABNORMAL LOW (ref 6.5–8.1)

## 2021-05-23 LAB — CBC
HCT: 39.9 % (ref 36.0–46.0)
Hemoglobin: 13.1 g/dL (ref 12.0–15.0)
MCH: 30.2 pg (ref 26.0–34.0)
MCHC: 32.8 g/dL (ref 30.0–36.0)
MCV: 91.9 fL (ref 80.0–100.0)
Platelets: 433 10*3/uL — ABNORMAL HIGH (ref 150–400)
RBC: 4.34 MIL/uL (ref 3.87–5.11)
RDW: 14 % (ref 11.5–15.5)
WBC: 16.1 10*3/uL — ABNORMAL HIGH (ref 4.0–10.5)
nRBC: 0 % (ref 0.0–0.2)

## 2021-05-23 LAB — BASIC METABOLIC PANEL
Anion gap: 5 (ref 5–15)
BUN: 30 mg/dL — ABNORMAL HIGH (ref 6–20)
CO2: 24 mmol/L (ref 22–32)
Calcium: 8.9 mg/dL (ref 8.9–10.3)
Chloride: 108 mmol/L (ref 98–111)
Creatinine, Ser: 0.88 mg/dL (ref 0.44–1.00)
GFR, Estimated: >60 mL/min (ref >60)
Glucose, Bld: 89 mg/dL (ref 70–99)
Potassium: 3.9 mmol/L (ref 3.5–5.1)
Sodium: 137 mmol/L (ref 135–145)

## 2021-05-23 LAB — LIPASE, BLOOD: Lipase: 31 U/L (ref 11–51)

## 2021-05-23 LAB — TROPONIN I (HIGH SENSITIVITY)
Troponin I (High Sensitivity): 7 ng/L (ref <18)
Troponin I (High Sensitivity): 7 ng/L (ref <18)

## 2021-05-23 NOTE — ED Provider Notes (Signed)
[facility] EMERGENCY DEPARTMENT Provider Note   CSN: [id number] Arrival date & time: 05/23/21  1026     History Chief Complaint  Patient presents with   Chest Pain    Robin Dawson is a 55 y.o. female Robin Dawson is a 55 y.o. female history of GERD, fibromyalgia, sleep apnea who is a smoker, currently undergoing a prednisone taper secondary to knee pain presenting with sudden onset of midsternal chest pain which occurred approximately 1 hour before arrival.  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  These sharp stabs were fleeting and when they subsided she had a constant pressure-like sensation which resolved after she took aspirin 325 mg.  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  Denies nausea or vomiting,  The history is provided by the patient.   HPI: A 55 year old patient with a history of obesity presents for evaluation of chest pain. Initial onset of pain was approximately 1-3 hours ago. The patient's chest pain is sharp and is not worse with exertion. The patient's chest pain is middle- or left-sided, is not well-localized, is not described as heaviness/pressure/tightness and does not radiate to the arms/jaw/neck. The patient does not complain of nausea and denies diaphoresis. The patient has smoked in the past 90 days. The patient has no history of stroke, has no history of peripheral artery disease, denies any history of treated diabetes, has no relevant family history of coronary artery disease (first degree relative at less than age 58), is not hypertensive and has no history of hypercholesterolemia.   Past Medical History:  Diagnosis Date   ADD (attention deficit disorder)    Anxiety    Chronic shoulder pain    right and left   DDD (degenerative disc disease), cervical    C3-4    Depression    Fibromyalgia    diag 12/13/14   GERD (gastroesophageal reflux disease)    no meds, diet controlled   Sleep apnea    does not use CPAP    Smoker    3/4 PPD    Patient Active Problem List   Diagnosis Date Noted   Encounter for screening colonoscopy 02/09/2019   Polypharmacy 02/09/2019   Attention deficit hyperactivity disorder (ADHD), predominantly inattentive type 03/02/2018   Smoker 03/06/2016    Past Surgical History:  Procedure Laterality Date   BILATERAL SALPINGECTOMY Bilateral 02/25/2016   Procedure: BILATERAL SALPINGECTOMY;  Surgeon: Megan Salon, MD;  Location: [facility];  Service: Gynecology;  Laterality: Bilateral;   CYSTOSCOPY N/A 02/25/2016   Procedure: CYSTOSCOPY;  Surgeon: Megan Salon, MD;  Location: [facility];  Service: Gynecology;  Laterality: N/A;   LAPAROSCOPIC HYSTERECTOMY N/A 02/25/2016   Procedure: HYSTERECTOMY TOTAL LAPAROSCOPIC;  Surgeon: Megan Salon, MD;  Location: [facility];  Service: Gynecology;  Laterality: N/A;  1.5 hours   TUBAL LIGATION Bilateral 1992   [facility]   WISDOM TOOTH EXTRACTION       OB History     Gravida  2   Para  2   Term  2   Preterm      AB      Living  2      SAB      IAB      Ectopic      Multiple  Live Births  2           Family History  Problem Relation Age of Onset   Alzheimer's disease Father    Bipolar disorder Mother    Breast cancer Maternal Grandmother    Diabetes Paternal Grandmother    Hypertension Paternal Grandfather    Bipolar disorder Sister    Cancer Sister 35       Lukemia   Colon cancer Neg Hx     Social History   Tobacco Use   Smoking status: Every Day    Packs/day: 0.50    Years: 30.00    Pack years: 15.00    Types: Cigarettes   Smokeless tobacco: Never  Vaping Use   Vaping Use: Never used  Substance Use Topics   Alcohol use: Not Currently    Alcohol/week: 2.0 standard drinks    Types: 2 Standard drinks or equivalent per week    Comment: rarely    Drug use: No    Home Medications Prior to Admission medications   Medication Sig Start Date End Date Taking? Authorizing Provider  albuterol (VENTOLIN HFA) 108 (90 Base) MCG/ACT inhaler Inhale 1-2 puffs into the lungs every 4 (four) hours as needed for shortness of breath or wheezing. 08/11/19  Yes [provider]  amphetamine-dextroamphetamine (ADDERALL) 30 MG tablet Take 30 mg by mouth daily.   Yes [provider]  cyclobenzaprine (FLEXERIL) 10 MG tablet Take 10 mg by mouth 3 (three) times daily as needed for muscle spasms. 05/14/21  Yes [provider]  escitalopram (LEXAPRO) 20 MG tablet Take 20 mg by mouth at bedtime.    Yes [provider]  LORazepam (ATIVAN) 1 MG tablet Take 1 mg by mouth every 8 (eight) hours as needed for anxiety.    Yes [provider]  predniSONE (DELTASONE) 10 MG tablet Take 10 mg by mouth See admin instructions. 5 TABS/DAY X 3 DAYS, 4TABS/DAY X 3 DAYS,3 TABS/DAY X 3 DAYS, 2 TABS/DAY X 3 DAYS, 1 TAB/DAY X 3 DAYS. 05/10/21  Yes [provider]  rOPINIRole (REQUIP) 0.5 MG tablet Take 1 tablet by mouth daily. 02/02/18  Yes [provider]  rosuvastatin (CRESTOR) 5 MG tablet Take 5 mg by mouth daily. 03/08/21  Yes [provider]  amoxicillin-clavulanate (AUGMENTIN) 875-125 MG tablet Take 1 tablet by mouth 2 (two) times daily. Patient not taking: No sig reported 09/05/19   Megan Salon, MD  estradiol (ESTRACE) 0.5 MG tablet TAKE (1) TABLET BY MOUTH ONCE DAILY. Patient not taking: No sig reported 11/07/20   Megan Salon, MD    Allergies    Lyrica [pregabalin], Monosulfiram, and Codeine  Review of Systems   Review of Systems  Constitutional:  Negative for chills, diaphoresis and fever.  HENT:  Negative for congestion and sore throat.   Eyes: Negative.   Respiratory:  Negative for chest tightness and shortness of breath.   Cardiovascular:  Positive for chest pain. Negative for palpitations.   Gastrointestinal:  Negative for abdominal pain, nausea and vomiting.  Genitourinary: Negative.   Musculoskeletal:  Negative for arthralgias, joint swelling and neck pain.  Skin: Negative.  Negative for rash and wound.  Neurological:  Negative for dizziness, weakness, light-headedness, numbness and headaches.  Psychiatric/Behavioral: Negative.    All other systems reviewed and are negative.  Physical Exam Updated Vital Signs BP (!) 152/89   Pulse 64   Temp 98.2 F (36.8 C) (Oral)   Resp 10   Ht 5' (1.524 m)  Wt 72.6 kg   LMP 01/28/2016   SpO2 99%   BMI 31.25 kg/m   Physical Exam Vitals and nursing note reviewed.  Constitutional:      Appearance: She is well-developed.  HENT:     Head: Normocephalic and atraumatic.  Eyes:     Conjunctiva/sclera: Conjunctivae normal.  Cardiovascular:     Rate and Rhythm: Normal rate and regular rhythm.     Heart sounds: Normal heart sounds. No murmur heard. Pulmonary:     Effort: Pulmonary effort is normal.     Breath sounds: Normal breath sounds. No wheezing, rhonchi or rales.  Abdominal:     General: Bowel sounds are normal.     Palpations: Abdomen is soft.     Tenderness: There is no abdominal tenderness.  Musculoskeletal:        General: Normal range of motion.     Cervical back: Normal range of motion.     Right lower leg: No edema.     Left lower leg: No edema.  Skin:    General: Skin is warm and dry.  Neurological:     Mental Status: She is alert.    ED Results / Procedures / Treatments   Labs (all labs ordered are listed, but only abnormal results are displayed) Labs Reviewed  BASIC METABOLIC PANEL - Abnormal; Notable for the following components:      Result Value   BUN 30 (*)    All other components within normal limits  CBC - Abnormal; Notable for the following components:   WBC 16.1 (*)    Platelets 433 (*)    All other components within normal limits  HEPATIC FUNCTION PANEL - Abnormal; Notable for the  following components:   Total Protein 6.4 (*)    Albumin 3.3 (*)    AST 14 (*)    All other components within normal limits  LIPASE, BLOOD  POC URINE PREG, ED  TROPONIN I (HIGH SENSITIVITY)  TROPONIN I (HIGH SENSITIVITY)    EKG EKG Interpretation  Date/Time:  Thursday May 23 2021 10:43:52 EDT Ventricular Rate:  76 PR Interval:  116 QRS Duration: 92 QT Interval:  378 QTC Calculation: 425 R Axis:   57 Text Interpretation: Normal sinus rhythm Normal ECG No significant change since last tracing Confirmed by Isla Pence [phone]) on 05/23/2021 11:22:17 AM  Radiology DG Chest 2 View  Result Date: 05/23/2021 CLINICAL DATA:  Chest pain beginning today.  Subsequent resolution. EXAM: CHEST - 2 VIEW COMPARISON:  05/23/2013 FINDINGS: Heart size is normal. Mediastinal shadows are normal. The lungs are clear. No bronchial thickening. No infiltrate, mass, effusion or collapse. Pulmonary vascularity is normal. No bony abnormality. IMPRESSION: Normal chest Electronically Signed   By: Nelson Chimes M.D.   On: 05/23/2021 11:35    Procedures Procedures   Medications Ordered in ED Medications - No data to display  ED Course  I have reviewed the triage vital signs and the nursing notes.  Pertinent labs & imaging results that were available during my care of the patient were reviewed by me and considered in my medical decision making (see chart for details).    MDM Rules/Calculators/A&P HEAR Score: 2                  Patient presenting with atypical chest pain now resolved.  She has a heart score 2, delta troponins are negative.  She does have an elevated to [facility] count, but is also currently on a prednisone taper.  EKG  is normal.  Today's episode of angina.  Her lungs are clear, no shortness of breath, no pleuritic symptoms to suggest PE.  She was felt medically cleared for discharge home.  Follow-up with her PCP for recheck within the next week. Final Clinical Impression(s) / ED  Diagnoses Final diagnoses:  Atypical chest pain    Rx / DC Orders ED Discharge Orders     None        Landis Martins 05/23/21 1339    Isla Pence, MD 05/23/21 1510

## 2021-05-23 NOTE — Discharge Instructions (Addendum)
Your exam, EKG, lab tests and chest x-ray are all normal today and reassuring.  Plan close follow-up with your primary MD.  In the interim, return here for recheck for any return or worsening of your symptoms.

## 2021-05-23 NOTE — ED Triage Notes (Signed)
Pt to er via ems, per ems pt is here for abd/chest pain.  Pt states that she is here for chest pain.  Pt states that her chest pain started about 915am when she was standing in a class.

## 2021-06-18 ENCOUNTER — Ambulatory Visit: Payer: Self-pay | Admitting: Orthopaedic Surgery

## 2021-07-19 ENCOUNTER — Ambulatory Visit (HOSPITAL_COMMUNITY)
Admission: EM | Admit: 2021-07-19 | Discharge: 2021-07-19 | Disposition: A | Discharge status: Home / Self Care | Payer: BC Managed Care – PPO

## 2021-07-19 DIAGNOSIS — F4321 Adjustment disorder with depressed mood: Secondary | ICD-10-CM

## 2021-07-19 NOTE — ED Notes (Signed)
Pt discharged with  AVS.  AVS reviewed prior to discharge.  Pt alert, oriented, and ambulatory.  Safety maintained.  °

## 2021-07-19 NOTE — Progress Notes (Signed)
   07/19/21 0932  [place] Triage Screening (Walk-ins at [facility] only)  How Did You Hear About [place]? Family/Friend  What Is the Reason for Your Visit/Call Today? Pt is a 56 yo female who presented voluntarily accompanied by her daughter, Robin Dawson, who attended and participated in her assessment with the pt's permission. Pt denies SI, HI, NSSH, AVH and drug/alcohol use except for vaping THC daily. Pt reported some paranoia about those she works with and her students who she believes often are trying to get her in trouble or fired. Pt is a Pharmacist, hospital. Pt is seeking help with anger outbursts she is having at school and at home which are not characteristic of her behavior. Pt reported having thoughts of slapping students or others at times but has never acted on her thoughts. Pt has also had her daughter-in-law and her 6 children move in with her in October which has been stressful.  How Long Has This Been Causing You Problems? 1-6 months  Have You Recently Had Any Thoughts About Hurting Yourself? No  Are You Planning to Commit Suicide/Harm Yourself At This time? No  Have you Recently Had Thoughts About [place]? Yes  How long ago did you have thoughts of harming others? at times thoughts of slapping students  Are You Planning To Harm Someone At This Time? No  Are you currently experiencing any auditory, visual or other hallucinations? No  Have You Used Any Alcohol or Drugs in the Past 24 Hours? No  Clinician description of patient physical appearance/behavior: Pt is calm and cooperative. Pt's mood is depressed and affect is congruent. Pt has been tearful throughout the assessment. Pt's speech, movement and thought processes appear to be within normal limits. Pt is alert and oriented x 4.  What Do You Feel Would Help You the Most Today? Treatment for Depression or other mood problem  If access to [facility] was not available, would you have sought care in the Emergency Department? Yes   Determination of Need Routine (7 days)  Options For Referral Outpatient Therapy;Medication Management  Robin Dawson T. Mare Ferrari, [place], [facility], [facility] Triage Specialist [facility]
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
## 2021-07-19 NOTE — Discharge Instructions (Signed)

## 2021-07-24 ENCOUNTER — Telehealth (HOSPITAL_COMMUNITY): Payer: Self-pay | Admitting: Family Medicine

## 2021-07-24 NOTE — BH Assessment (Signed)
Care Management - Follow Up [facility] Discharges   Writer attempted to make contact with patient today and was unsuccessful.  Writer left a HIPPA compliant voice message.   Per chart review, pt provided with outpatient resources

## 2021-07-31 DIAGNOSIS — F319 Bipolar disorder, unspecified: Secondary | ICD-10-CM | POA: Insufficient documentation

## 2021-08-26 ENCOUNTER — Encounter (HOSPITAL_BASED_OUTPATIENT_CLINIC_OR_DEPARTMENT_OTHER): Payer: Self-pay | Admitting: Obstetrics & Gynecology

## 2021-08-26 ENCOUNTER — Other Ambulatory Visit: Payer: Self-pay

## 2021-08-26 ENCOUNTER — Ambulatory Visit (INDEPENDENT_AMBULATORY_CARE_PROVIDER_SITE_OTHER): Payer: BC Managed Care – PPO | Admitting: Obstetrics & Gynecology

## 2021-08-26 VITALS — BP 170/94 | HR 91 | Ht 60.0 in | Wt 154.4 lb

## 2021-08-26 DIAGNOSIS — Z0000 Encounter for general adult medical examination without abnormal findings: Secondary | ICD-10-CM

## 2021-08-26 DIAGNOSIS — Z01419 Encounter for gynecological examination (general) (routine) without abnormal findings: Secondary | ICD-10-CM

## 2021-08-26 DIAGNOSIS — Z1211 Encounter for screening for malignant neoplasm of colon: Secondary | ICD-10-CM | POA: Diagnosis not present

## 2021-08-26 DIAGNOSIS — D72829 Elevated white blood cell count, unspecified: Secondary | ICD-10-CM

## 2021-08-26 DIAGNOSIS — Z114 Encounter for screening for human immunodeficiency virus [HIV]: Secondary | ICD-10-CM

## 2021-08-26 DIAGNOSIS — F17200 Nicotine dependence, unspecified, uncomplicated: Secondary | ICD-10-CM

## 2021-08-26 DIAGNOSIS — D75839 Thrombocytosis, unspecified: Secondary | ICD-10-CM

## 2021-08-26 DIAGNOSIS — R748 Abnormal levels of other serum enzymes: Secondary | ICD-10-CM

## 2021-08-26 DIAGNOSIS — Z90710 Acquired absence of both cervix and uterus: Secondary | ICD-10-CM

## 2021-08-26 DIAGNOSIS — Z1159 Encounter for screening for other viral diseases: Secondary | ICD-10-CM

## 2021-08-26 DIAGNOSIS — Z5320 Procedure and treatment not carried out because of patient's decision for unspecified reasons: Secondary | ICD-10-CM

## 2021-08-26 DIAGNOSIS — N909 Noninflammatory disorder of vulva and perineum, unspecified: Secondary | ICD-10-CM

## 2021-08-26 DIAGNOSIS — Z79890 Hormone replacement therapy (postmenopausal): Secondary | ICD-10-CM

## 2021-08-26 DIAGNOSIS — N951 Menopausal and female climacteric states: Secondary | ICD-10-CM

## 2021-08-26 LAB — CBC WITH DIFFERENTIAL/PLATELET
Basophils Absolute: 0.1 10*3/uL (ref 0.0–0.2)
Basos: 1 %
EOS (ABSOLUTE): 0.4 10*3/uL (ref 0.0–0.4)
Eos: 5 %
Hematocrit: 42.2 % (ref 34.0–46.6)
Hemoglobin: 13.9 g/dL (ref 11.1–15.9)
Immature Grans (Abs): 0 10*3/uL (ref 0.0–0.1)
Immature Granulocytes: 0 %
Lymphocytes Absolute: 2.8 10*3/uL (ref 0.7–3.1)
Lymphs: 30 %
MCH: 28.8 pg (ref 26.6–33.0)
MCHC: 32.9 g/dL (ref 31.5–35.7)
MCV: 88 fL (ref 79–97)
Monocytes Absolute: 0.5 10*3/uL (ref 0.1–0.9)
Monocytes: 5 %
Neutrophils Absolute: 5.4 10*3/uL (ref 1.4–7.0)
Neutrophils: 59 %
Platelets: 501 10*3/uL — ABNORMAL HIGH (ref 150–450)
RBC: 4.82 x10E6/uL (ref 3.77–5.28)
RDW: 13.2 % (ref 11.7–15.4)
WBC: 9.2 10*3/uL (ref 3.4–10.8)

## 2021-08-26 MED ORDER — ESTRADIOL 0.5 MG PO TABS: 0.5000 mg | ORAL_TABLET | Freq: Every day | ORAL | 0 refills | Status: DC

## 2021-08-26 NOTE — Progress Notes (Signed)
55 y.o. [name] Single White or Caucasian female here for annual exam.  Is on FMLA from work.  Having so many stressors with work this year.  [facility]  She has been able to do remote appointments.  Diagnosed with dipolar d/o.  Single for 3 years.  Not SA.  Denies vaginal bleeding.   Ran out of estradiol.  Wants to restart.  Having a lot of hot flashes.  Risks reviewed.  Patient's last menstrual period was 01/28/2016.          Sexually active: No The current method of family planning is status post hysterectomy.    Exercising: No.   Smoker:  yes  Health Maintenance: Pap:  01/14/2016 Negative History of abnormal Pap:  no MMG:  03/12/2021 Negative Colonoscopy:  declines.  Will do cologuard BMD:   plan closer to age 42 Screening Labs: due today, would like drawn today here   reports that she has been smoking cigarettes. She has a 15.00 pack-year smoking history. She has never used smokeless tobacco. She reports that she does not currently use alcohol after a past usage of about 2.0 standard drinks per week. She reports that she does not use drugs.  Past Medical History:  Diagnosis Date   ADD (attention deficit disorder)    Anxiety    Chronic shoulder pain    right and left   DDD (degenerative disc disease), cervical    C3-4   Depression    Fibromyalgia    diag 12/13/14   GERD (gastroesophageal reflux disease)    no meds, diet controlled   Sleep apnea    does not use CPAP    Smoker    3/4 PPD    Past Surgical History:  Procedure Laterality Date   BILATERAL SALPINGECTOMY Bilateral 02/25/2016   Procedure: BILATERAL SALPINGECTOMY;  Surgeon: Robin Salon, MD;  Location: [facility];  Service: Gynecology;  Laterality: Bilateral;   CYSTOSCOPY N/A 02/25/2016   Procedure: CYSTOSCOPY;  Surgeon: Robin Salon, MD;  Location: [facility];  Service: Gynecology;  Laterality: N/A;   LAPAROSCOPIC HYSTERECTOMY N/A 02/25/2016   Procedure: HYSTERECTOMY TOTAL  LAPAROSCOPIC;  Surgeon: Robin Salon, MD;  Location: [facility];  Service: Gynecology;  Laterality: N/A;  1.5 hours   TUBAL LIGATION Bilateral 1992   [place]   WISDOM TOOTH EXTRACTION      Current Outpatient Medications  Medication Sig Dispense Refill   albuterol (VENTOLIN HFA) 108 (90 Base) MCG/ACT inhaler Inhale 1-2 puffs into the lungs every 4 (four) hours as needed for shortness of breath or wheezing.     amphetamine-dextroamphetamine (ADDERALL) 30 MG tablet Take 30 mg by mouth daily.     cyclobenzaprine (FLEXERIL) 10 MG tablet Take 10 mg by mouth 3 (three) times daily as needed for muscle spasms.     escitalopram (LEXAPRO) 20 MG tablet Take 20 mg by mouth at bedtime.      LORazepam (ATIVAN) 1 MG tablet Take 1 mg by mouth every 8 (eight) hours as needed for anxiety.      rOPINIRole (REQUIP) 0.5 MG tablet Take 1 tablet by mouth daily.     rosuvastatin (CRESTOR) 5 MG tablet Take 5 mg by mouth daily.     estradiol (ESTRACE) 0.5 MG tablet TAKE (1) TABLET BY MOUTH ONCE DAILY. (Patient not taking: Reported on 08/26/2021) 90 tablet 0   No current facility-administered medications for this visit.    Family History  Problem Relation Age of Onset  Alzheimer's disease Father    Bipolar disorder Mother    Breast cancer Maternal Grandmother    Diabetes Paternal Grandmother    Hypertension Paternal Grandfather    Bipolar disorder Sister    Cancer Sister 36       Lukemia   Colon cancer Neg Hx     Review of Systems  All other systems reviewed and are negative.  Exam:   LMP 01/28/2016      General appearance: alert, cooperative and appears stated age Head: Normocephalic, without obvious abnormality, atraumatic Neck: no adenopathy, supple, symmetrical, trachea midline and thyroid normal to inspection and palpation Lungs: clear to auscultation bilaterally Breasts: normal appearance, no masses or tenderness Heart: regular rate and rhythm Abdomen: soft, non-tender; bowel sounds normal;  no masses,  no organomegaly Extremities: extremities normal, atraumatic, no cyanosis or edema Skin: Skin color, texture, turgor normal. No rashes or lesions Lymph nodes: Cervical, supraclavicular, and axillary nodes normal. No abnormal inguinal nodes palpated Neurologic: Grossly normal   Pelvic: External genitalia:  periclitoral pigmentation, image taken for comparison, 4 x 3mm              Urethra:  normal appearing urethra with no masses, tenderness or lesions              Bartholins and Skenes: normal                 Vagina: normal appearing vagina with normal color and no discharge, no lesions              Cervix: absent              Pap taken: No. Bimanual Exam:  Uterus:  uterus absent              Adnexa: no mass, fullness, tenderness               Rectovaginal: Confirms               Anus:  normal sphincter tone, no lesions      Chaperone, Robin Dawson, CMA, was present for exam.  Assessment/Plan: 1. Well woman exam with routine gynecological exam - pap smear not indicated - MMG 03/2021 - declines colonoscopy but willing to do cologuard - vaccines reviewed/updated.  Pt aware I do not think her tdap is up to date. - lab work ordered  2. Leukocytosis, unspecified type - CBC with Differential/Platelet  3. Colon cancer screening - Cologuard  4. Smoker  5. H/O: hysterectomy  6. Blood tests for routine general physical examination - Comprehensive metabolic panel - VITAMIN D 25 Hydroxy (Vit-D Deficiency, Fractures) - TSH - Lipid panel - Hep C antibody - HIV  7.  Hot flashes - will restart estradiol 0.5mg  daily.  #90/0RF.  Asked pt to give updated in about 6 weeks.    8. Vulvar pigmentation - will monitor as is very close to clitoris.  Recheck 4 months.

## 2021-08-27 LAB — HEPATITIS C ANTIBODY: Hep C Virus Ab: 0.1 s/co ratio (ref 0.0–0.9)

## 2021-08-27 LAB — LIPID PANEL
Chol/HDL Ratio: 2.5 ratio (ref 0.0–4.4)
Cholesterol, Total: 197 mg/dL (ref 100–199)
HDL: 78 mg/dL (ref >39)
LDL Chol Calc (NIH): 101 mg/dL — ABNORMAL HIGH (ref 0–99)
Triglycerides: 102 mg/dL (ref 0–149)
VLDL Cholesterol Cal: 18 mg/dL (ref 5–40)

## 2021-08-27 LAB — COMPREHENSIVE METABOLIC PANEL
ALT: 18 IU/L (ref 0–32)
AST: 20 IU/L (ref 0–40)
Albumin/Globulin Ratio: 1.6 (ref 1.2–2.2)
Albumin: 4.4 g/dL (ref 3.8–4.9)
Alkaline Phosphatase: 142 IU/L — ABNORMAL HIGH (ref 44–121)
BUN/Creatinine Ratio: 15 (ref 9–23)
BUN: 14 mg/dL (ref 6–24)
Bilirubin Total: <0.2 mg/dL (ref 0.0–1.2)
CO2: 26 mmol/L (ref 20–29)
Calcium: 9.8 mg/dL (ref 8.7–10.2)
Chloride: 99 mmol/L (ref 96–106)
Creatinine, Ser: 0.94 mg/dL (ref 0.57–1.00)
Globulin, Total: 2.7 g/dL (ref 1.5–4.5)
Glucose: 87 mg/dL (ref 70–99)
Potassium: 4.6 mmol/L (ref 3.5–5.2)
Sodium: 139 mmol/L (ref 134–144)
Total Protein: 7.1 g/dL (ref 6.0–8.5)
eGFR: 73 mL/min/{1.73_m2} (ref >59)

## 2021-08-27 LAB — TSH: TSH: 2.29 u[IU]/mL (ref 0.450–4.500)

## 2021-08-27 LAB — HIV ANTIBODY (ROUTINE TESTING W REFLEX): HIV Screen 4th Generation wRfx: NONREACTIVE

## 2021-08-27 LAB — VITAMIN D 25 HYDROXY (VIT D DEFICIENCY, FRACTURES): Vit D, 25-Hydroxy: 62.9 ng/mL (ref 30.0–100.0)

## 2021-09-24 ENCOUNTER — Encounter (HOSPITAL_BASED_OUTPATIENT_CLINIC_OR_DEPARTMENT_OTHER): Payer: Self-pay | Admitting: Obstetrics & Gynecology

## 2021-09-25 ENCOUNTER — Other Ambulatory Visit (HOSPITAL_BASED_OUTPATIENT_CLINIC_OR_DEPARTMENT_OTHER): Payer: Self-pay | Admitting: Obstetrics & Gynecology

## 2021-09-25 MED ORDER — ESTRADIOL 0.5 MG PO TABS: 0.5000 mg | ORAL_TABLET | Freq: Every day | ORAL | 3 refills | Status: DC

## 2021-09-28 ENCOUNTER — Encounter (HOSPITAL_BASED_OUTPATIENT_CLINIC_OR_DEPARTMENT_OTHER): Payer: Self-pay | Admitting: Obstetrics & Gynecology

## 2021-09-28 ENCOUNTER — Other Ambulatory Visit (HOSPITAL_BASED_OUTPATIENT_CLINIC_OR_DEPARTMENT_OTHER): Payer: Self-pay | Admitting: Obstetrics & Gynecology

## 2021-09-28 MED ORDER — CIPROFLOXACIN-DEXAMETHASONE 0.3-0.1 % OT SUSP: 4.0000 [drp] | Freq: Two times a day (BID) | OTIC | 0 refills | Status: DC

## 2021-09-29 ENCOUNTER — Encounter (HOSPITAL_BASED_OUTPATIENT_CLINIC_OR_DEPARTMENT_OTHER): Payer: Self-pay | Admitting: Obstetrics & Gynecology

## 2021-09-30 ENCOUNTER — Other Ambulatory Visit: Payer: Self-pay

## 2021-09-30 ENCOUNTER — Other Ambulatory Visit (HOSPITAL_BASED_OUTPATIENT_CLINIC_OR_DEPARTMENT_OTHER): Payer: BC Managed Care – PPO

## 2021-11-29 ENCOUNTER — Ambulatory Visit: Payer: BC Managed Care – PPO | Admitting: Orthopedic Surgery

## 2021-12-25 ENCOUNTER — Ambulatory Visit (HOSPITAL_BASED_OUTPATIENT_CLINIC_OR_DEPARTMENT_OTHER): Payer: BC Managed Care – PPO | Admitting: Obstetrics & Gynecology

## 2022-05-04 IMAGING — DX DG CHEST 2V
2 series · 2 of 2 positions shown · non-contrast
Comparison: 05/23/2013

CLINICAL DATA: Chest pain beginning today.  Subsequent resolution.

EXAM:
CHEST - 2 VIEW

[chest pa]
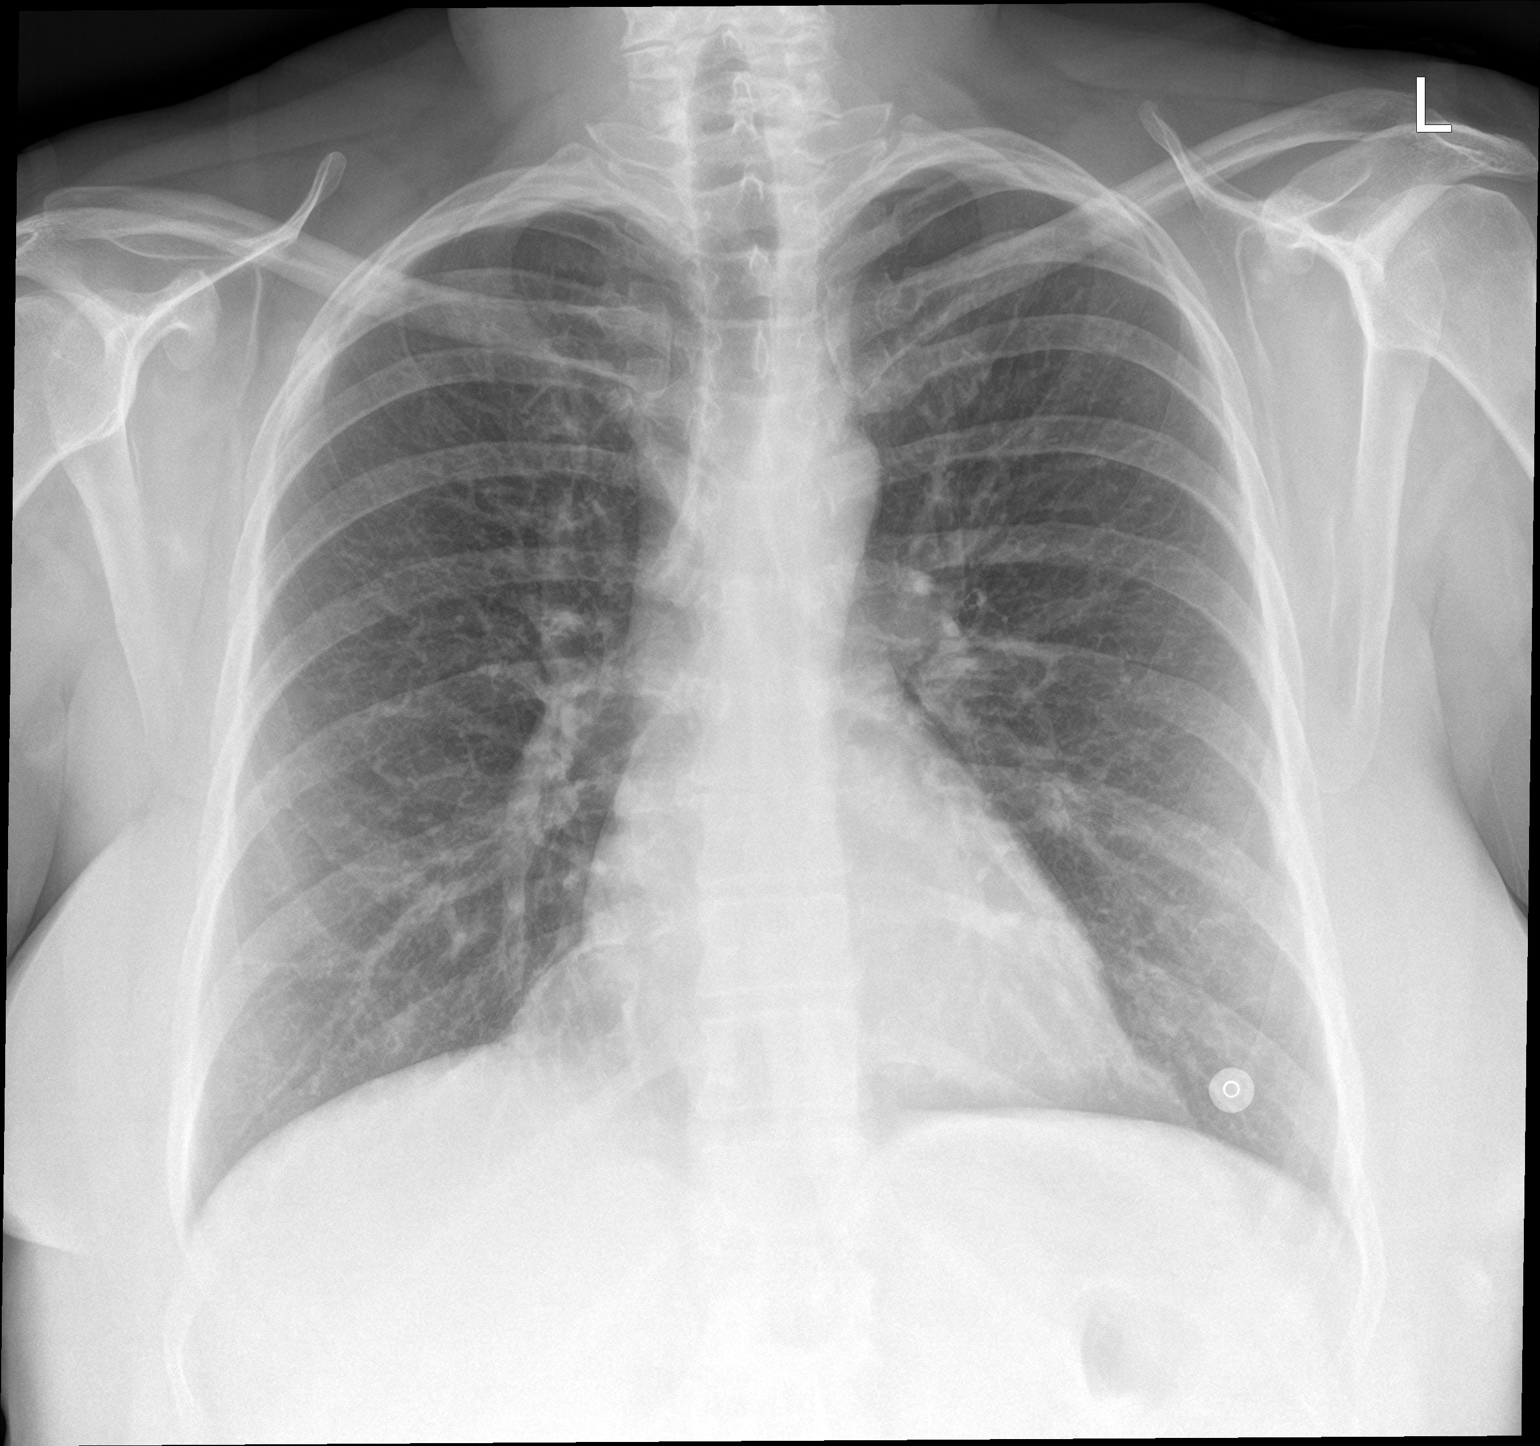

[chest lat]
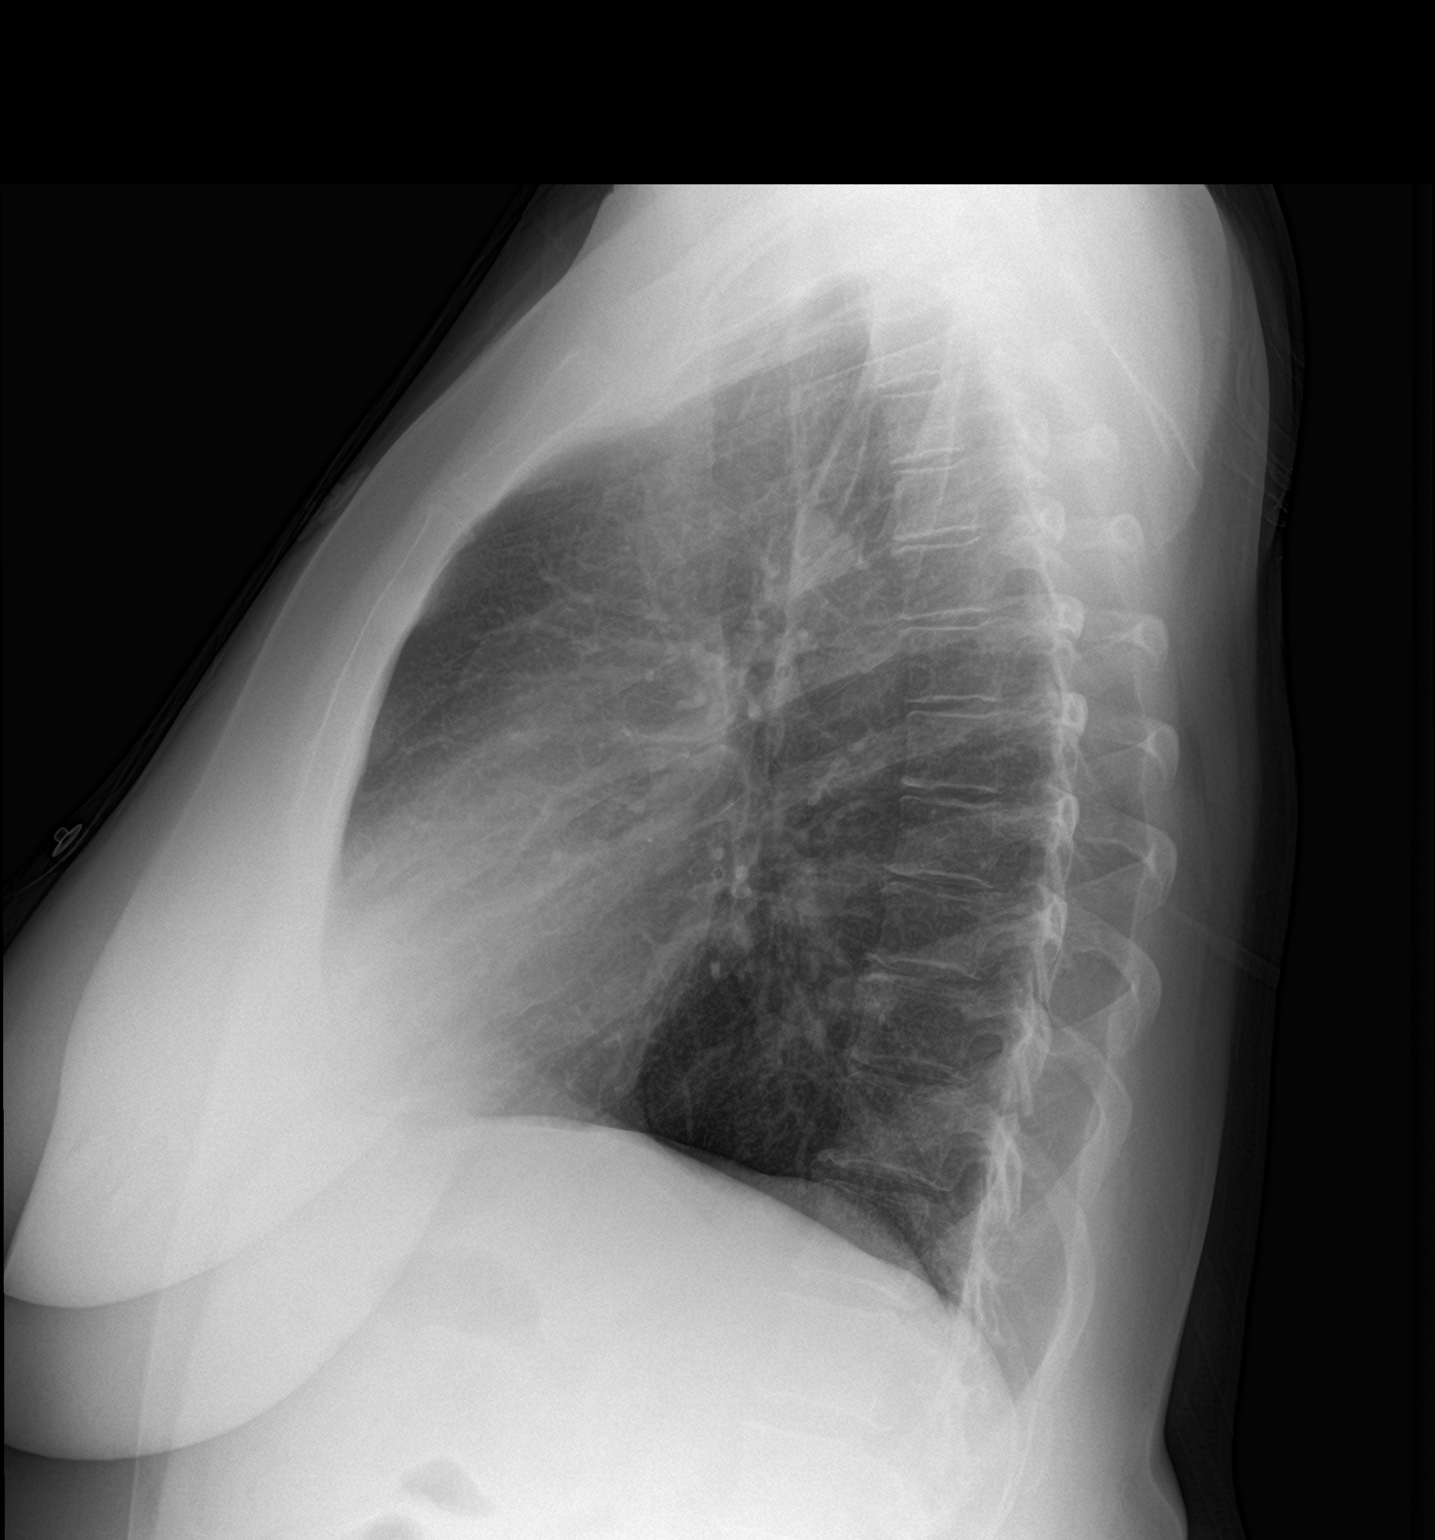

[2 of 2 positions shown; findings below may reference images not displayed]

FINDINGS: Heart size is normal. Mediastinal shadows are normal. The lungs are
clear. No bronchial thickening. No infiltrate, mass, effusion or
collapse. Pulmonary vascularity is normal. No bony abnormality.
IMPRESSION: Normal chest

## 2022-07-04 ENCOUNTER — Encounter (HOSPITAL_BASED_OUTPATIENT_CLINIC_OR_DEPARTMENT_OTHER): Payer: Self-pay | Admitting: Obstetrics & Gynecology

## 2022-08-29 ENCOUNTER — Encounter (HOSPITAL_BASED_OUTPATIENT_CLINIC_OR_DEPARTMENT_OTHER): Payer: Self-pay | Admitting: Obstetrics & Gynecology

## 2022-08-29 ENCOUNTER — Ambulatory Visit (INDEPENDENT_AMBULATORY_CARE_PROVIDER_SITE_OTHER): Payer: BC Managed Care – PPO | Admitting: Obstetrics & Gynecology

## 2022-08-29 VITALS — BP 145/91 | HR 92 | Ht 61.0 in | Wt 140.4 lb

## 2022-08-29 DIAGNOSIS — Z23 Encounter for immunization: Secondary | ICD-10-CM

## 2022-08-29 DIAGNOSIS — Z01419 Encounter for gynecological examination (general) (routine) without abnormal findings: Secondary | ICD-10-CM

## 2022-08-29 DIAGNOSIS — Z90710 Acquired absence of both cervix and uterus: Secondary | ICD-10-CM | POA: Diagnosis not present

## 2022-08-29 DIAGNOSIS — F17200 Nicotine dependence, unspecified, uncomplicated: Secondary | ICD-10-CM

## 2022-08-29 DIAGNOSIS — Z9229 Personal history of other drug therapy: Secondary | ICD-10-CM | POA: Diagnosis not present

## 2022-08-29 DIAGNOSIS — N9089 Other specified noninflammatory disorders of vulva and perineum: Secondary | ICD-10-CM

## 2022-08-29 MED ORDER — ESTRADIOL 0.5 MG PO TABS: 0.5000 mg | ORAL_TABLET | Freq: Every day | ORAL | 3 refills | Status: AC

## 2022-08-29 NOTE — Progress Notes (Signed)
56 y.o. Robin Dawson Single White or Caucasian female here for annual exam.  Had to stop teaching due to stressors.  Sold home.  Living with roommate.  Is still doing substitute teaching.  Denies vaginal bleeding.  On HRT.  Doing well.    Is aware her BP is mildly elevated.  Was normal at PCP's office.  Has some anxiety with driving.    Patient's last menstrual period was 01/28/2016.          Sexually active: No.  The current method of family planning is status post hysterectomy.    Exercising: No.   Smoker:  yes  Health Maintenance: Pap:  01/14/2016 History of abnormal Pap:  no MMG:  07/04/2022 Negative Colonoscopy:  has been referred by her PCP BMD:   not indicated Screening Labs: done with PCP   reports that she has been smoking cigarettes. She has a 15.00 pack-year smoking history. She has never used smokeless tobacco. She reports that she does not currently use alcohol after a past usage of about 2.0 standard drinks of alcohol per week. She reports that she does not use drugs.  Past Medical History:  Diagnosis Date   ADD (attention deficit disorder)    Anxiety    Chronic shoulder pain    right and left   DDD (degenerative disc disease), cervical    C3-4   Depression    Fibromyalgia    diag 12/13/14   GERD (gastroesophageal reflux disease)    no meds, diet controlled   Sleep apnea    does not use CPAP    Smoker    3/4 PPD    Past Surgical History:  Procedure Laterality Date   BILATERAL SALPINGECTOMY Bilateral 02/25/2016   Procedure: BILATERAL SALPINGECTOMY;  Surgeon: Megan Salon, MD;  Location: [facility];  Service: Gynecology;  Laterality: Bilateral;   CYSTOSCOPY N/A 02/25/2016   Procedure: CYSTOSCOPY;  Surgeon: Megan Salon, MD;  Location: [facility];  Service: Gynecology;  Laterality: N/A;   LAPAROSCOPIC HYSTERECTOMY N/A 02/25/2016   Procedure: HYSTERECTOMY TOTAL LAPAROSCOPIC;  Surgeon: Megan Salon, MD;  Location: [facility];  Service: Gynecology;  Laterality: N/A;  1.5 hours    TUBAL LIGATION Bilateral 1992   [place]   WISDOM TOOTH EXTRACTION      Current Outpatient Medications  Medication Sig Dispense Refill   amphetamine-dextroamphetamine (ADDERALL) 30 MG tablet Take 30 mg by mouth daily.     escitalopram (LEXAPRO) 20 MG tablet Take 20 mg by mouth at bedtime.      estradiol (ESTRACE) 0.5 MG tablet Take 1 tablet (0.5 mg total) by mouth daily. 90 tablet 3   LORazepam (ATIVAN) 1 MG tablet Take 1 mg by mouth every 8 (eight) hours as needed for anxiety.      rosuvastatin (CRESTOR) 5 MG tablet Take 5 mg by mouth daily.     No current facility-administered medications for this visit.    Family History  Problem Relation Age of Onset   Alzheimer's disease Father    Bipolar disorder Mother    Breast cancer Maternal Grandmother    Diabetes Paternal Grandmother    Hypertension Paternal Grandfather    Bipolar disorder Sister    Cancer Sister 61       Lukemia   Colon cancer Neg Hx     ROS: Constitutional: negative Genitourinary:negative  Exam:   BP (!) 145/91 (BP Location: Left Arm, Patient Position: Sitting, Cuff Size: Normal)   Pulse 92   Ht '5\' 1"'$  (1.549 m)  Comment: Reported  Wt 140 lb 6.4 oz (63.7 kg)   LMP 01/28/2016   BMI 26.53 kg/m   Height: '5\' 1"'$  (154.9 cm) (Reported)  General appearance: alert, cooperative and appears stated age Head: Normocephalic, without obvious abnormality, atraumatic Neck: no adenopathy, supple, symmetrical, trachea midline and thyroid normal to inspection and palpation Lungs: clear to auscultation bilaterally Breasts: normal appearance, no masses or tenderness Heart: regular rate and rhythm Abdomen: soft, non-tender; bowel sounds normal; no masses,  no organomegaly Extremities: extremities normal, atraumatic, no cyanosis or edema Skin: Skin color, texture, turgor normal. No rashes or lesions Lymph nodes: Cervical, supraclavicular, and axillary nodes normal. No abnormal inguinal nodes palpated Neurologic: Grossly  normal   Pelvic: External genitalia:  pigmented lesion that is just to the left of her clitoris, stable compared to last year              Urethra:  normal appearing urethra with no masses, tenderness or lesions              Bartholins and Skenes: normal                 Vagina: normal appearing vagina with normal color and no discharge, no lesions              Cervix: absent              Pap taken: No. Bimanual Exam:  Uterus:  uterus absent              Adnexa: not evaluated               Rectovaginal: Confirms               Anus:  normal sphincter tone, no lesions  Chaperone, Octaviano Batty, CMA, was present for exam.  Assessment/Plan: 1. Well woman exam with routine gynecological exam - Pap smear not indicated - Mammogram 06/2022 - Colonoscopy referral will be done by PCP - Bone mineral density not indicated at this time - lab work done with PCP, Dr. Hilma Favors - vaccines reviewed/updated.  Tdap will be done today.  2. Smoker  3. H/O: hysterectomy  4. History of postmenopausal HRT - RF for estradiol 0.'5mg'$  daily.  #90/4Rf  5. Vulvar lesion - stable.  Referred back to picture taken last year and it looks exactly the same.  Reviewed with pt.

## 2022-11-06 ENCOUNTER — Encounter: Payer: Self-pay | Admitting: Radiology

## 2023-09-14 ENCOUNTER — Ambulatory Visit (HOSPITAL_BASED_OUTPATIENT_CLINIC_OR_DEPARTMENT_OTHER): Payer: 59 | Admitting: Obstetrics & Gynecology
